# Patient Record
Sex: Female | Born: 1965
Health system: Southern US, Community
[De-identification: ages and names within clinical notes are randomized; demographics above are authoritative.]

## PROBLEM LIST (undated history)

## (undated) DIAGNOSIS — F419 Anxiety disorder, unspecified: Secondary | ICD-10-CM

## (undated) DIAGNOSIS — M1990 Unspecified osteoarthritis, unspecified site: Secondary | ICD-10-CM

## (undated) DIAGNOSIS — K635 Polyp of colon: Secondary | ICD-10-CM

## (undated) DIAGNOSIS — M25851 Other specified joint disorders, right hip: Secondary | ICD-10-CM

## (undated) DIAGNOSIS — E079 Disorder of thyroid, unspecified: Secondary | ICD-10-CM

## (undated) DIAGNOSIS — N200 Calculus of kidney: Secondary | ICD-10-CM

## (undated) DIAGNOSIS — M722 Plantar fascial fibromatosis: Secondary | ICD-10-CM

## (undated) HISTORY — DX: Polyp of colon: K63.5

## (undated) HISTORY — DX: Calculus of kidney: N20.0

## (undated) HISTORY — DX: Anxiety disorder, unspecified: F41.9

## (undated) HISTORY — DX: Unspecified osteoarthritis, unspecified site: M19.90

## (undated) HISTORY — DX: Plantar fascial fibromatosis: M72.2

## (undated) HISTORY — PX: BUNIONECTOMY: SHX129

## (undated) HISTORY — PX: TUBAL LIGATION: SHX77

## (undated) HISTORY — DX: Other specified joint disorders, right hip: M25.851

---

## 2012-08-27 DIAGNOSIS — M76899 Other specified enthesopathies of unspecified lower limb, excluding foot: Secondary | ICD-10-CM | POA: Insufficient documentation

## 2016-10-07 DIAGNOSIS — F40240 Claustrophobia: Secondary | ICD-10-CM | POA: Insufficient documentation

## 2017-04-10 DIAGNOSIS — D126 Benign neoplasm of colon, unspecified: Secondary | ICD-10-CM | POA: Insufficient documentation

## 2019-12-28 DIAGNOSIS — M722 Plantar fascial fibromatosis: Secondary | ICD-10-CM | POA: Insufficient documentation

## 2020-11-14 LAB — HM PAP SMEAR: HM Pap smear: NORMAL

## 2021-05-09 ENCOUNTER — Other Ambulatory Visit: Payer: Self-pay

## 2021-05-09 ENCOUNTER — Emergency Department
Admission: RE | Admit: 2021-05-09 | Discharge: 2021-05-09 | Disposition: A | Discharge status: Home / Self Care | Payer: BC Managed Care – PPO | Source: Ambulatory Visit

## 2021-05-09 VITALS — BP 124/85 | HR 82 | Temp 98.6°F | Resp 16 | Ht 62.0 in | Wt 129.0 lb

## 2021-05-09 DIAGNOSIS — H6690 Otitis media, unspecified, unspecified ear: Secondary | ICD-10-CM | POA: Diagnosis not present

## 2021-05-09 HISTORY — DX: Disorder of thyroid, unspecified: E07.9

## 2021-05-09 MED ORDER — AMOXICILLIN 500 MG PO CAPS: 500.0000 mg | ORAL_CAPSULE | Freq: Three times a day (TID) | ORAL | 0 refills | Status: DC

## 2021-05-09 NOTE — Discharge Instructions (Addendum)
See primary care for recheck or recheck here in 10 days.

## 2021-05-09 NOTE — ED Triage Notes (Signed)
Rt ear pain x 1 week Vaccinated

## 2021-05-10 NOTE — ED Provider Notes (Signed)
[name]    CSN: [id number] Arrival date & time: 05/09/21  1737      History   Chief Complaint Chief Complaint  Patient presents with  . Otalgia    HPI Kelly Holmes is a 55 y.o. female.   The history is provided by the patient. No language interpreter was used.  Otalgia Location:  Right Behind ear:  No abnormality Quality:  Aching Severity:  Moderate Onset quality:  Gradual Duration:  2 days Timing:  Constant Progression:  Worsening Chronicity:  New Relieved by:  Nothing Worsened by:  Nothing Ineffective treatments:  None tried Associated symptoms: no hearing loss   Risk factors: no recent travel     Past Medical History:  Diagnosis Date  . Thyroid disease     There are no problems to display for this patient.   History reviewed. No pertinent surgical history.  OB History   No obstetric history on file.      Home Medications    Prior to Admission medications   Medication Sig Start Date End Date Taking? Authorizing Provider  amoxicillin (AMOXIL) 500 MG capsule Take 1 capsule (500 mg total) by mouth 3 (three) times daily. 05/09/21  Yes Caryl Ada K, PA-C  levothyroxine (SYNTHROID) 100 MCG tablet Take 100 mcg by mouth daily before breakfast.   Yes [provider]    Family History Family History  Problem Relation Age of Onset  . Healthy Mother     Social History Social History   Tobacco Use  . Smoking status: Never Smoker  . Smokeless tobacco: Never Used  Vaping Use  . Vaping Use: Never used  Substance Use Topics  . Alcohol use: Yes     Allergies   Codeine and Naproxen   Review of Systems Review of Systems  HENT: Positive for ear pain. Negative for hearing loss.   All other systems reviewed and are negative.    Physical Exam Triage Vital Signs ED Triage Vitals  Enc Vitals Group     BP 05/09/21 1753 124/85     Pulse Rate 05/09/21 1753 82     Resp 05/09/21 1753 16     Temp 05/09/21 1753 98.6 F  (37 C)     Temp Source 05/09/21 1753 Oral     SpO2 --      Weight 05/09/21 1754 129 lb (58.5 kg)     Height 05/09/21 1754 5\' 2"  (1.575 m)     Head Circumference --      Peak Flow --      Pain Score 05/09/21 1754 8     Pain Loc --      Pain Edu? --      Excl. in [place]? --    No data found.  Updated Vital Signs BP 124/85 (BP Location: Right Arm)   Pulse 82   Temp 98.6 F (37 C) (Oral)   Resp 16   Ht 5\' 2"  (1.575 m)   Wt 58.5 kg   BMI 23.59 kg/m   Visual Acuity Right Eye Distance:   Left Eye Distance:   Bilateral Distance:    Right Eye Near:   Left Eye Near:    Bilateral Near:     Physical Exam Vitals reviewed.  HENT:     Left Ear: Tympanic membrane normal.     Ears:     Comments: Erythema around right tm.  White area occluding visualization of landmarks.  Cardiovascular:     Rate and Rhythm: Normal rate.  Pulmonary:     Effort: Pulmonary effort is normal.  Musculoskeletal:        General: Normal range of motion.  Skin:    General: Skin is warm.  Neurological:     General: No focal deficit present.     Mental Status: She is alert.  Psychiatric:        Mood and Affect: Mood normal.      UC Treatments / Results  Labs (all labs ordered are listed, but only abnormal results are displayed) Labs Reviewed - No data to display  EKG   Radiology No results found.  Procedures Procedures (including critical care time)  Medications Ordered in UC Medications - No data to display  Initial Impression / Assessment and Plan / UC Course  I have reviewed the triage vital signs and the nursing notes.  Pertinent labs & imaging results that were available during my care of the patient were reviewed by me and considered in my medical decision making (see chart for details).     MDM:  Pt given rx for antibiotics. I advised pt to recheck as bones are not visible.  Pt needs recheck if area does not clear  Consider ent referral  (possible unusual appearance of  cholesteatoma)  Final Clinical Impressions(s) / UC Diagnoses   Final diagnoses:  Acute otitis media, unspecified otitis media type     Discharge Instructions     See primary care for recheck or recheck here in 10 days.    ED Prescriptions    Medication Sig Dispense Auth. Provider   amoxicillin (AMOXIL) 500 MG capsule Take 1 capsule (500 mg total) by mouth 3 (three) times daily. 30 capsule Fransico Meadow, [place]     PDMP not reviewed this encounter.   Fransico Meadow, [place] 05/10/21 1004

## 2021-05-22 ENCOUNTER — Encounter: Payer: Self-pay | Admitting: Medical-Surgical

## 2021-05-22 ENCOUNTER — Other Ambulatory Visit: Payer: Self-pay

## 2021-05-22 ENCOUNTER — Ambulatory Visit: Payer: BC Managed Care – PPO | Admitting: Medical-Surgical

## 2021-05-22 VITALS — BP 109/71 | HR 73 | Temp 98.5°F | Ht 62.25 in | Wt 131.7 lb

## 2021-05-22 DIAGNOSIS — E039 Hypothyroidism, unspecified: Secondary | ICD-10-CM | POA: Diagnosis not present

## 2021-05-22 DIAGNOSIS — F419 Anxiety disorder, unspecified: Secondary | ICD-10-CM

## 2021-05-22 DIAGNOSIS — Z8669 Personal history of other diseases of the nervous system and sense organs: Secondary | ICD-10-CM

## 2021-05-22 DIAGNOSIS — Z09 Encounter for follow-up examination after completed treatment for conditions other than malignant neoplasm: Secondary | ICD-10-CM | POA: Diagnosis not present

## 2021-05-22 DIAGNOSIS — Z7689 Persons encountering health services in other specified circumstances: Secondary | ICD-10-CM

## 2021-05-22 NOTE — Progress Notes (Signed)
New Patient Office Visit  Subjective:  Patient ID: Kelly Holmes, female    DOB: [birth year]  Age: 55 y.o. MRN: [id number]  CC:  Chief Complaint  Patient presents with  . Establish Care    HPI Kelly Holmes presents to establish care.  [age]-year-old female who has relocated from [place].  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  She moved to [place] with her husband.  Was recently seen in urgent care for right otitis media.  She was treated with amoxicillin which she completed without side effects.  Notes that about 4 days into her treatment, her right ear pain disappeared.  She no longer has any issues with ear pain, drainage, popping, or fullness.  Was afebrile on evaluation and has not had any fever since then.  Hypothyroidism-has been on levothyroxine 50 mcg daily, tolerating well without side effects.  Has been on this dose for several years.  At her last thyroid level checked in December and will be due again for recheck in December of this year.  Anxiety-Notes that her anxiety is more of a panic attack scenario.  She uses Xanax 0.25 mg daily as needed but only averages approximately 2 doses per month.  She uses the Xanax when she awakens with significant anxiety and cannot shake it through the day.  Past Medical History:  Diagnosis Date  . Anxiety   . Colon polyps   . Femoroacetabular impingement of right hip   . Kidney stone   . Plantar fasciitis   . Thyroid disease     Past Surgical History:  Procedure Laterality Date  . BUNIONECTOMY    . TUBAL LIGATION      Family History  Problem Relation Age of Onset  . Healthy Mother   . Hypertension Father   . Heart attack Father   . Diabetes Father   . Skin cancer Sister   . Cancer Paternal Grandfather     Social History   Socioeconomic History  . Marital status: Married    Spouse name: Not on file  . Number of children: Not on file  . Years of  education: Not on file  . Highest education level: Not on file  Occupational History  . Not on file  Tobacco Use  . Smoking status: Never Smoker  . Smokeless tobacco: Never Used  Vaping Use  . Vaping Use: Never used  Substance and Sexual Activity  . Alcohol use: Yes    Alcohol/week: 2.0 standard drinks    Types: 2 Standard drinks or equivalent per week  . Drug use: Never  . Sexual activity: Yes    Partners: Male    Birth control/protection: Surgical, Post-menopausal    Comment: tubal ligation  Other Topics Concern  . Not on file  Social History Narrative  . Not on file   Social Determinants of Health   Financial Resource Strain: Not on file  Food Insecurity: Not on file  Transportation Needs: Not on file  Physical Activity: Not on file  Stress: Not on file  Social Connections: Not on file  Intimate Partner Violence: Not on file    ROS Review of Systems  Constitutional: Negative for chills, fatigue, fever and unexpected weight change.  HENT: Negative for ear discharge and ear pain.   Respiratory: Negative for cough, chest tightness, shortness of breath and wheezing.   Cardiovascular: Negative for chest pain, palpitations and leg swelling.  Genitourinary: Negative for dysuria, frequency, hematuria  and urgency.  Neurological: Negative for dizziness, light-headedness and headaches.  Psychiatric/Behavioral: Negative for dysphoric mood, self-injury, sleep disturbance and suicidal ideas. The patient is not nervous/anxious.     Objective:   Today's Vitals: BP 109/71   Pulse 73   Temp 98.5 F (36.9 C)   Ht 5' 2.25" (1.581 m)   Wt 131 lb 11.2 oz (59.7 kg)   SpO2 98%   BMI 23.90 kg/m   Physical Exam Vitals reviewed.  Constitutional:      General: She is not in acute distress.    Appearance: Normal appearance.  HENT:     Head: Normocephalic and atraumatic.  Cardiovascular:     Rate and Rhythm: Normal rate and regular rhythm.     Pulses: Normal pulses.     Heart  sounds: Normal heart sounds. No murmur heard. No friction rub. No gallop.   Pulmonary:     Effort: Pulmonary effort is normal. No respiratory distress.     Breath sounds: Normal breath sounds. No wheezing.  Skin:    General: Skin is warm and dry.  Neurological:     Mental Status: She is alert and oriented to person, place, and time.  Psychiatric:        Mood and Affect: Mood normal.        Behavior: Behavior normal.        Thought Content: Thought content normal.        Judgment: Judgment normal.    Assessment & Plan:   1. Encounter to establish care Reviewed available information and discussed healthcare concerns with patient.  She is up-to-date on preventative care although she is due for shingles vaccine.  She would like to get that in the fall when she gets her flu shot.  2. Otitis media follow-up, infection resolved Otitis media resolved.  3. Hypothyroidism, unspecified type Continue levothyroxine 50 mcg daily.  We will plan for recheck of TSH in December.  4. Anxiety Continue Xanax 0.25 mg daily as needed for anxiety.  Please use very sparingly and only for severe issues.   Outpatient Encounter Medications as of 05/22/2021  Medication Sig  . albuterol (VENTOLIN HFA) 108 (90 Base) MCG/ACT inhaler Inhale into the lungs every 6 (six) hours as needed for wheezing or shortness of breath.  . ALPRAZolam (XANAX) 0.25 MG tablet Take 0.25 mg by mouth daily as needed.  . Cholecalciferol (VITAMIN D) 50 MCG (2000 UT) tablet Take 2,000 Units by mouth daily.  Marland Kitchen levothyroxine (SYNTHROID) 50 MCG tablet Take 50 mcg by mouth daily.  . melatonin 5 MG TABS Take 5 mg by mouth at bedtime as needed.  . Probiotic Product (PROBIOTIC PO) Take by mouth daily.  Marland Kitchen UNABLE TO FIND Med Name: Plexus Bio Cleanse  . YUVAFEM 10 MCG TABS vaginal tablet Place 1 tablet vaginally 2 (two) times a week.  Marland Kitchen amoxicillin (AMOXIL) 500 MG capsule Take 1 capsule (500 mg total) by mouth 3 (three) times daily.  .  chlorhexidine (PERIDEX) 0.12 % solution SMARTSIG:By Mouth  . [DISCONTINUED] levothyroxine (SYNTHROID) 100 MCG tablet Take 100 mcg by mouth daily before breakfast.   No facility-administered encounter medications on file as of 05/22/2021.    Follow-up: Return in about 6 months (around 11/21/2021) for annual physical exam.   Clearnce Sorrel, DNP, APRN, FNP-BC [facility]

## 2021-11-21 ENCOUNTER — Encounter: Payer: BC Managed Care – PPO | Admitting: Medical-Surgical

## 2022-01-02 ENCOUNTER — Ambulatory Visit (INDEPENDENT_AMBULATORY_CARE_PROVIDER_SITE_OTHER): Payer: BC Managed Care – PPO | Admitting: Medical-Surgical

## 2022-01-02 ENCOUNTER — Other Ambulatory Visit: Payer: Self-pay

## 2022-01-02 ENCOUNTER — Encounter: Payer: Self-pay | Admitting: Medical-Surgical

## 2022-01-02 VITALS — BP 122/89 | HR 102 | Resp 20 | Ht 62.0 in | Wt 130.6 lb

## 2022-01-02 DIAGNOSIS — Z0000 Encounter for general adult medical examination without abnormal findings: Secondary | ICD-10-CM

## 2022-01-02 DIAGNOSIS — Z808 Family history of malignant neoplasm of other organs or systems: Secondary | ICD-10-CM | POA: Diagnosis not present

## 2022-01-02 DIAGNOSIS — Z23 Encounter for immunization: Secondary | ICD-10-CM | POA: Diagnosis not present

## 2022-01-02 DIAGNOSIS — Z1329 Encounter for screening for other suspected endocrine disorder: Secondary | ICD-10-CM | POA: Diagnosis not present

## 2022-01-02 DIAGNOSIS — R21 Rash and other nonspecific skin eruption: Secondary | ICD-10-CM

## 2022-01-02 DIAGNOSIS — Z131 Encounter for screening for diabetes mellitus: Secondary | ICD-10-CM

## 2022-01-02 DIAGNOSIS — E039 Hypothyroidism, unspecified: Secondary | ICD-10-CM

## 2022-01-02 DIAGNOSIS — H579 Unspecified disorder of eye and adnexa: Secondary | ICD-10-CM | POA: Diagnosis not present

## 2022-01-02 DIAGNOSIS — Z1211 Encounter for screening for malignant neoplasm of colon: Secondary | ICD-10-CM

## 2022-01-02 MED ORDER — CLOTRIMAZOLE-BETAMETHASONE 1-0.05 % EX CREA: 1.0000 "application " | TOPICAL_CREAM | Freq: Two times a day (BID) | CUTANEOUS | 1 refills | Status: DC | PRN

## 2022-01-02 NOTE — Progress Notes (Signed)
Medical screening examination/treatment was performed by qualified clinical staff member and as supervising physician I was immediately available for consultation/collaboration. I have reviewed documentation and agree with assessment and plan.  Clearnce Sorrel, DNP, APRN, FNP-BC [facility]

## 2022-01-02 NOTE — Patient Instructions (Signed)
Preventive Care 55-55 Years Old, Female °Preventive care refers to lifestyle choices and visits with your health care provider that can promote health and wellness. Preventive care visits are also called wellness exams. °What can I expect for my preventive care visit? °Counseling °Your health care provider may ask you questions about your: °Medical history, including: °Past medical problems. °Family medical history. °Pregnancy history. °Current health, including: °Menstrual cycle. °Method of birth control. °Emotional well-being. °Home life and relationship well-being. °Sexual activity and sexual health. °Lifestyle, including: °Alcohol, nicotine or tobacco, and drug use. °Access to firearms. °Diet, exercise, and sleep habits. °Work and work environment. °Sunscreen use. °Safety issues such as seatbelt and bike helmet use. °Physical exam °Your health care provider will check your: °Height and weight. These may be used to calculate your BMI (body mass index). BMI is a measurement that tells if you are at a healthy weight. °Waist circumference. This measures the distance around your waistline. This measurement also tells if you are at a healthy weight and may help predict your risk of certain diseases, such as type 2 diabetes and high blood pressure. °Heart rate and blood pressure. °Body temperature. °Skin for abnormal spots. °What immunizations do I need? °Vaccines are usually given at various ages, according to a schedule. Your health care provider will recommend vaccines for you based on your age, medical history, and lifestyle or other factors, such as travel or where you work. °What tests do I need? °Screening °Your health care provider may recommend screening tests for certain conditions. This may include: °Lipid and cholesterol levels. °Diabetes screening. This is done by checking your blood sugar (glucose) after you have not eaten for a while (fasting). °Pelvic exam and Pap test. °Hepatitis B test. °Hepatitis C  test. °HIV (human immunodeficiency virus) test. °STI (sexually transmitted infection) testing, if you are at risk. °Lung cancer screening. °Colorectal cancer screening. °Mammogram. Talk with your health care provider about when you should start having regular mammograms. This may depend on whether you have a family history of breast cancer. °BRCA-related cancer screening. This may be done if you have a family history of breast, ovarian, tubal, or peritoneal cancers. °Bone density scan. This is done to screen for osteoporosis. °Talk with your health care provider about your test results, treatment options, and if necessary, the need for more tests. °Follow these instructions at home: °Eating and drinking ° °Eat a diet that includes fresh fruits and vegetables, whole grains, lean protein, and low-fat dairy products. °Take vitamin and mineral supplements as recommended by your health care provider. °Do not drink alcohol if: °Your health care provider tells you not to drink. °You are pregnant, may be pregnant, or are planning to become pregnant. °If you drink alcohol: °Limit how much you have to 0-1 drink a day. °Know how much alcohol is in your drink. In the [place] one drink equals one 12 oz bottle of beer (355 mL), one 5 oz glass of wine (148 mL), or one 1½ oz glass of hard liquor (44 mL). °Lifestyle °Brush your teeth every morning and night with fluoride toothpaste. Floss one time each day. °Exercise for at least 30 minutes 5 or more days each week. °Do not use any products that contain nicotine or tobacco. These products include cigarettes, chewing tobacco, and vaping devices, such as e-cigarettes. If you need help quitting, ask your health care provider. °Do not use drugs. °If you are sexually active, practice safe sex. Use a condom or other form of protection to prevent   STIs. If you do not wish to become pregnant, use a form of birth control. If you plan to become pregnant, see your health care provider for a  prepregnancy visit. Take aspirin only as told by your health care provider. Make sure that you understand how much to take and what form to take. Work with your health care provider to find out whether it is safe and beneficial for you to take aspirin daily. Find healthy ways to manage stress, such as: Meditation, yoga, or listening to music. Journaling. Talking to a trusted person. Spending time with friends and family. Minimize exposure to UV radiation to reduce your risk of skin cancer. Safety Always wear your seat belt while driving or riding in a vehicle. Do not drive: If you have been drinking alcohol. Do not ride with someone who has been drinking. When you are tired or distracted. While texting. If you have been using any mind-altering substances or drugs. Wear a helmet and other protective equipment during sports activities. If you have firearms in your house, make sure you follow all gun safety procedures. Seek help if you have been physically or sexually abused. What's next? Visit your health care provider once a year for an annual wellness visit. Ask your health care provider how often you should have your eyes and teeth checked. Stay up to date on all vaccines. This information is not intended to replace advice given to you by your health care provider. Make sure you discuss any questions you have with your health care provider. Document Revised: 05/29/2021 Document Reviewed: 05/29/2021 Elsevier Patient Education  [place].

## 2022-01-02 NOTE — Progress Notes (Signed)
CC:  annual exam  HPI: Kelly Holmes is a 56 y.o. pleasant female who  has a past medical history of Anxiety, Arthritis (2013), Colon polyps, Femoroacetabular impingement of right hip, Kidney stone, Plantar fasciitis, and Thyroid disease.  she presents to [facility] today, for chief complaint of: Annual physical exam  Dentist:  being seen by dentist for recent root canal and gum swelling. Improved, but sometimes notices swelling.  Eye exam: was seen last Jan where they told her she had blood vessels in cornea, notes some eye drooping. Denies vision changes. Needs referral. Exercise: Pilates 3x/week as well as pickle ball Diet: no restrictions Pap smear: UTD Mammogram: UTD Colon cancer screening: referral to GI needed for abnormal colonoscopy COVID vaccine: UTD  Concerns: Pleasant female presenting for annual exam. Would like a dermatology referral d/t family hx of skin cancer.Complaint today is a rash under each armpit. She noticed over the last couple weeks patches under her arm pits. She changed deodorant and tried hydrocortisone cream on it. Michela Pitcher it helped some, but still present. States it itches mostly after exercise. Denies burning or d/c.   Past medical, surgical, social and family history reviewed:  Patient Active Problem List   Diagnosis Date Noted   Acquired hypothyroidism 01/02/2022   Plantar fasciitis 12/28/2019   Colon adenomas 04/10/2017   Claustrophobia 10/07/2016   Enthesopathy of hip region 08/27/2012    Past Surgical History:  Procedure Laterality Date   BUNIONECTOMY     TUBAL LIGATION      Social History   Tobacco Use   Smoking status: Never   Smokeless tobacco: Never  Substance Use Topics   Alcohol use: Yes    Alcohol/week: 2.0 standard drinks    Types: 2 Standard drinks or equivalent per week    Family History  Problem Relation Age of Onset   Healthy Mother    Arthritis Mother    Depression Mother     Hypertension Father    Heart attack Father    Diabetes Father    Cancer Father    Skin cancer Sister    Cancer Paternal Grandfather    Cancer Maternal Grandfather    ADD / ADHD Daughter    Cancer Sister    Hypertension Sister      Current medication list and allergy/intolerance information reviewed:    Current Outpatient Medications  Medication Sig Dispense Refill   ALPRAZolam (XANAX) 0.25 MG tablet Take 0.25 mg by mouth daily as needed.     Cholecalciferol (VITAMIN D) 50 MCG (2000 UT) tablet Take 2,000 Units by mouth daily.     clotrimazole-betamethasone (LOTRISONE) cream Apply 1 application topically 2 (two) times daily as needed. Do not use for more than 14 consecutive days. 30 g 1   levothyroxine (SYNTHROID) 50 MCG tablet Take 50 mcg by mouth daily.     melatonin 5 MG TABS Take 5 mg by mouth at bedtime as needed.     Probiotic Product (PROBIOTIC PO) Take by mouth daily.     UNABLE TO FIND Med Name: Plexus Bio Cleanse     YUVAFEM 10 MCG TABS vaginal tablet Place 1 tablet vaginally 2 (two) times a week.     No current facility-administered medications for this visit.    Allergies  Allergen Reactions   Codeine Hives and Nausea And Vomiting   Naproxen Hives   Hydrocodone-Acetaminophen Hives      Review of Systems: Constitutional:  No  fever, no chills, No recent illness,  No unintentional weight changes. No significant fatigue.  HEENT: No  headache, no vision change, no hearing change, No sore throat, No  sinus pressure Cardiac: No  chest pain, No  pressure, No palpitations, No  Orthopnea Respiratory:  No  shortness of breath. No  Cough Gastrointestinal: No  abdominal pain, No  nausea, No  vomiting,  No  blood in stool, No  diarrhea, No  constipation  Musculoskeletal: No new myalgia/arthralgia Skin: positive for Rash bilateral armpits, No other wounds/concerning lesions Genitourinary: No  incontinence, No  abnormal genital bleeding, No abnormal genital discharge Hem/Onc:  No  easy bruising/bleeding, No  abnormal lymph node Endocrine: No cold intolerance,  No heat intolerance. No polyuria/polydipsia/polyphagia  Neurologic: No  weakness, No  dizziness, No  slurred speech/focal weakness/facial droop Psychiatric: No  concerns with depression, No  concerns with anxiety, No sleep problems, No mood problems  Exam:  BP 122/89 (BP Location: Left Arm, Patient Position: Sitting, Cuff Size: Normal)    Pulse (!) 102    Resp 20    Ht _0  (1.575 m)    Wt 59.2 kg    SpO2 98%    BMI 23.89 kg/m   Constitutional: VS see above. General Appearance: alert, well-developed, well-nourished, NAD Eyes: Normal lids and conjunctive, non-icteric sclera Ears, Nose, Mouth, Throat: MMM, Normal external inspection ears/nares/mouth/lips/gums. TM normal bilaterally.  Neck: No masses, trachea midline. No thyroid enlargement. No tenderness/mass appreciated. No lymphadenopathy Respiratory: Normal respiratory effort. no wheeze, no rhonchi, no rales Cardiovascular: S1/S2 normal, no murmur, no rub/gallop auscultated. RRR. No lower extremity edema. Pedal pulse II/IV bilaterally DP and PT. No carotid bruit or JVD. Gastrointestinal: Nontender, no masses. No hepatomegaly, no splenomegaly. No hernia appreciated. Bowel sounds normal. Rectal exam deferred.  Musculoskeletal: Gait normal. No clubbing/cyanosis of digits.  Neurological: Normal balance/coordination. No tremor. No cranial nerve deficit on limited exam. Motor and sensation intact and symmetric. Cerebellar reflexes intact.  Skin:Rash present: multiple erythematous macules present bilaterally under each axilla with well defined edges. No papules or vesicles present. Under woods lamp,slightly green fluorescent. No scaling or drainage noted.   No concerning nevi or subq nodules on limited exam.   Psychiatric: Normal judgment/insight. Normal mood and affect. Oriented x3.    ASSESSMENT/PLAN:   1. Annual physical exam Routine lab work today - Lipid  panel - COMPLETE METABOLIC PANEL WITH GFR - CBC with Differential/Platelet  2. Need for shingles vaccine Receiving vaccine today - Varicella-zoster vaccine IM (Shingrix)  3. Family history of skin cancer Would like referral to derm  - Ambulatory referral to Dermatology  4. Eye exam abnormal Was seen in January, noted to have blood vessels in cornea and eye drooping. Would like referral to ophthalmology. No abnormal finding on exam. - Ambulatory referral to Ophthalmology  5. Colon cancer screening Need referral to GI for hx of abnormal colonoscopy - Ambulatory referral to Gastroenterology  6. Rash Discussed keeping area clean and dry, discussed using baby powder, and changing soap. Discussed changing out razor and using baby oil when shaving to help with irritation. Will trial Lotrisone cream to each armpit and follow up if symptoms worsening or not going away. - clotrimazole-betamethasone (LOTRISONE) cream; Apply 1 application topically 2 (two) times daily as needed. Do not use for more than 14 consecutive days.  Dispense: 30 g; Refill: 1  7. Diabetes mellitus screening Lab work today - Hemoglobin A1c  8. Acquired hypothyroidism Lab work today  - TSH   Orders Placed This Encounter  Procedures  Varicella-zoster vaccine IM (Shingrix)   TSH   Lipid panel   COMPLETE METABOLIC PANEL WITH GFR   CBC with Differential/Platelet   Hemoglobin A1c   HM PAP SMEAR   Ambulatory referral to Dermatology   Ambulatory referral to Ophthalmology   Ambulatory referral to Gastroenterology    Meds ordered this encounter  Medications   clotrimazole-betamethasone (LOTRISONE) cream    Sig: Apply 1 application topically 2 (two) times daily as needed. Do not use for more than 14 consecutive days.    Dispense:  30 g    Refill:  1    Order Specific Question:   Supervising Provider    Answer:   Luetta Nutting [[id number]]    Patient Instructions  Preventive Care 67-42 Years Old,  Female Preventive care refers to lifestyle choices and visits with your health care provider that can promote health and wellness. Preventive care visits are also called wellness exams. What can I expect for my preventive care visit? Counseling Your health care provider may ask you questions about your: Medical history, including: Past medical problems. Family medical history. Pregnancy history. Current health, including: Menstrual cycle. Method of birth control. Emotional well-being. Home life and relationship well-being. Sexual activity and sexual health. Lifestyle, including: Alcohol, nicotine or tobacco, and drug use. Access to firearms. Diet, exercise, and sleep habits. Work and work Statistician. Sunscreen use. Safety issues such as seatbelt and bike helmet use. Physical exam Your health care provider will check your: Height and weight. These may be used to calculate your BMI (body mass index). BMI is a measurement that tells if you are at a healthy weight. Waist circumference. This measures the distance around your waistline. This measurement also tells if you are at a healthy weight and may help predict your risk of certain diseases, such as type 2 diabetes and high blood pressure. Heart rate and blood pressure. Body temperature. Skin for abnormal spots. What immunizations do I need? Vaccines are usually given at various ages, according to a schedule. Your health care provider will recommend vaccines for you based on your age, medical history, and lifestyle or other factors, such as travel or where you work. What tests do I need? Screening Your health care provider may recommend screening tests for certain conditions. This may include: Lipid and cholesterol levels. Diabetes screening. This is done by checking your blood sugar (glucose) after you have not eaten for a while (fasting). Pelvic exam and Pap test. Hepatitis B test. Hepatitis C test. HIV (human immunodeficiency  virus) test. STI (sexually transmitted infection) testing, if you are at risk. Lung cancer screening. Colorectal cancer screening. Mammogram. Talk with your health care provider about when you should start having regular mammograms. This may depend on whether you have a family history of breast cancer. BRCA-related cancer screening. This may be done if you have a family history of breast, ovarian, tubal, or peritoneal cancers. Bone density scan. This is done to screen for osteoporosis. Talk with your health care provider about your test results, treatment options, and if necessary, the need for more tests. Follow these instructions at home: Eating and drinking  Eat a diet that includes fresh fruits and vegetables, whole grains, lean protein, and low-fat dairy products. Take vitamin and mineral supplements as recommended by your health care provider. Do not drink alcohol if: Your health care provider tells you not to drink. You are pregnant, may be pregnant, or are planning to become pregnant. If you drink alcohol: Limit how much you have to  0-1 drink a day. Know how much alcohol is in your drink. In the [place] one drink equals one 12 oz bottle of beer (355 mL), one 5 oz glass of wine (148 mL), or one 1 oz glass of hard liquor (44 mL). Lifestyle Brush your teeth every morning and night with fluoride toothpaste. Floss one time each day. Exercise for at least 30 minutes 5 or more days each week. Do not use any products that contain nicotine or tobacco. These products include cigarettes, chewing tobacco, and vaping devices, such as e-cigarettes. If you need help quitting, ask your health care provider. Do not use drugs. If you are sexually active, practice safe sex. Use a condom or other form of protection to prevent STIs. If you do not wish to become pregnant, use a form of birth control. If you plan to become pregnant, see your health care provider for a prepregnancy visit. Take aspirin only  as told by your health care provider. Make sure that you understand how much to take and what form to take. Work with your health care provider to find out whether it is safe and beneficial for you to take aspirin daily. Find healthy ways to manage stress, such as: Meditation, yoga, or listening to music. Journaling. Talking to a trusted person. Spending time with friends and family. Minimize exposure to UV radiation to reduce your risk of skin cancer. Safety Always wear your seat belt while driving or riding in a vehicle. Do not drive: If you have been drinking alcohol. Do not ride with someone who has been drinking. When you are tired or distracted. While texting. If you have been using any mind-altering substances or drugs. Wear a helmet and other protective equipment during sports activities. If you have firearms in your house, make sure you follow all gun safety procedures. Seek help if you have been physically or sexually abused. What's next? Visit your health care provider once a year for an annual wellness visit. Ask your health care provider how often you should have your eyes and teeth checked. Stay up to date on all vaccines. This information is not intended to replace advice given to you by your health care provider. Make sure you discuss any questions you have with your health care provider. Document Revised: 05/29/2021 Document Reviewed: 05/29/2021 Elsevier Patient Education  2022 [employer].    Follow-up plan: Return for Follow up as needed, or return in 1 year for annual well exam.  Jeanann Lewandowsky, Student NP

## 2022-01-03 LAB — COMPLETE METABOLIC PANEL WITH GFR
AG Ratio: 2 (calc) (ref 1.0–2.5)
ALT: 16 U/L (ref 6–29)
AST: 18 U/L (ref 10–35)
Albumin: 4.9 g/dL (ref 3.6–5.1)
Alkaline phosphatase (APISO): 75 U/L (ref 37–153)
BUN: 21 mg/dL (ref 7–25)
CO2: 33 mmol/L — ABNORMAL HIGH (ref 20–32)
Calcium: 10.2 mg/dL (ref 8.6–10.4)
Chloride: 103 mmol/L (ref 98–110)
Creat: 0.88 mg/dL (ref 0.50–1.03)
Globulin: 2.5 g/dL (calc) (ref 1.9–3.7)
Glucose, Bld: 71 mg/dL (ref 65–99)
Potassium: 4.3 mmol/L (ref 3.5–5.3)
Sodium: 140 mmol/L (ref 135–146)
Total Bilirubin: 0.5 mg/dL (ref 0.2–1.2)
Total Protein: 7.4 g/dL (ref 6.1–8.1)
eGFR: 78 mL/min/{1.73_m2} (ref >=60)

## 2022-01-03 LAB — LIPID PANEL
Cholesterol: 204 mg/dL — ABNORMAL HIGH (ref <200)
HDL: 96 mg/dL (ref >=50)
LDL Cholesterol (Calc): 94 mg/dL (calc)
Non-HDL Cholesterol (Calc): 108 mg/dL (calc) (ref <130)
Total CHOL/HDL Ratio: 2.1 (calc) (ref <5.0)
Triglycerides: 48 mg/dL (ref <150)

## 2022-01-03 LAB — CBC WITH DIFFERENTIAL/PLATELET
Absolute Monocytes: 335 cells/uL (ref 200–950)
Basophils Absolute: 10 cells/uL (ref 0–200)
Basophils Relative: 0.2 %
Eosinophils Absolute: 30 cells/uL (ref 15–500)
Eosinophils Relative: 0.6 %
HCT: 42.8 % (ref 35.0–45.0)
Hemoglobin: 14.4 g/dL (ref 11.7–15.5)
Lymphs Abs: 1575 cells/uL (ref 850–3900)
MCH: 29.4 pg (ref 27.0–33.0)
MCHC: 33.6 g/dL (ref 32.0–36.0)
MCV: 87.3 fL (ref 80.0–100.0)
MPV: 9.1 fL (ref 7.5–12.5)
Monocytes Relative: 6.7 %
Neutro Abs: 3050 cells/uL (ref 1500–7800)
Neutrophils Relative %: 61 %
Platelets: 309 10*3/uL (ref 140–400)
RBC: 4.9 10*6/uL (ref 3.80–5.10)
RDW: 12.7 % (ref 11.0–15.0)
Total Lymphocyte: 31.5 %
WBC: 5 10*3/uL (ref 3.8–10.8)

## 2022-01-03 LAB — TSH: TSH: 2.17 mIU/L

## 2022-01-03 LAB — HEMOGLOBIN A1C
Hgb A1c MFr Bld: 5.2 % of total Hgb (ref <5.7)
Mean Plasma Glucose: 103 mg/dL
eAG (mmol/L): 5.7 mmol/L

## 2022-01-04 ENCOUNTER — Encounter: Payer: Self-pay | Admitting: Medical-Surgical

## 2022-01-04 DIAGNOSIS — Z1231 Encounter for screening mammogram for malignant neoplasm of breast: Secondary | ICD-10-CM

## 2022-01-14 ENCOUNTER — Telehealth: Payer: Self-pay | Admitting: Physician Assistant

## 2022-01-14 NOTE — Telephone Encounter (Signed)
Patient is calling for a referral appointment from Samuel Bouche, NP.  Patient is schedule for 08/06/2022 at 2:00 with [facility], PA-C.

## 2022-01-14 NOTE — Telephone Encounter (Signed)
Mammogram ordered. Patient is aware.

## 2022-01-15 NOTE — Telephone Encounter (Signed)
Referral attached to appointment

## 2022-01-30 ENCOUNTER — Other Ambulatory Visit: Payer: Self-pay | Admitting: Medical-Surgical

## 2022-01-30 MED ORDER — LEVOTHYROXINE SODIUM 50 MCG PO TABS: 50.0000 ug | ORAL_TABLET | Freq: Every day | ORAL | 3 refills | Status: DC

## 2022-03-03 ENCOUNTER — Encounter: Payer: Self-pay | Admitting: Medical-Surgical

## 2022-03-03 DIAGNOSIS — M79671 Pain in right foot: Secondary | ICD-10-CM

## 2022-03-04 NOTE — Telephone Encounter (Signed)
Referral pended please sign if appropriate.

## 2022-03-13 ENCOUNTER — Ambulatory Visit (INDEPENDENT_AMBULATORY_CARE_PROVIDER_SITE_OTHER): Payer: BC Managed Care – PPO

## 2022-03-13 DIAGNOSIS — M79672 Pain in left foot: Secondary | ICD-10-CM

## 2022-03-13 DIAGNOSIS — M84374A Stress fracture, right foot, initial encounter for fracture: Secondary | ICD-10-CM | POA: Diagnosis not present

## 2022-03-13 DIAGNOSIS — Z1231 Encounter for screening mammogram for malignant neoplasm of breast: Secondary | ICD-10-CM | POA: Diagnosis not present

## 2022-03-13 DIAGNOSIS — Z0389 Encounter for observation for other suspected diseases and conditions ruled out: Secondary | ICD-10-CM | POA: Diagnosis not present

## 2022-03-14 ENCOUNTER — Encounter: Payer: Self-pay | Admitting: Podiatry

## 2022-03-14 ENCOUNTER — Ambulatory Visit: Payer: BC Managed Care – PPO | Admitting: Podiatry

## 2022-03-14 DIAGNOSIS — M722 Plantar fascial fibromatosis: Secondary | ICD-10-CM

## 2022-03-14 DIAGNOSIS — M79672 Pain in left foot: Secondary | ICD-10-CM

## 2022-03-14 DIAGNOSIS — M84374A Stress fracture, right foot, initial encounter for fracture: Secondary | ICD-10-CM | POA: Diagnosis not present

## 2022-03-14 DIAGNOSIS — M79671 Pain in right foot: Secondary | ICD-10-CM

## 2022-03-14 MED ORDER — METHYLPREDNISOLONE 4 MG PO TBPK: ORAL_TABLET | ORAL | 0 refills | Status: DC

## 2022-03-14 NOTE — Progress Notes (Signed)
?  Subjective:  ?Patient ID: Kelly Holmes, female    DOB: June 09, 1966,   MRN: [id number] ? ?Chief Complaint  ?Patient presents with  ? Foot Pain  ?  Bilateral foot pain   ? ? ?56 y.o. female presents for concern of bilateral foot pain that has been ongoing since the fall of 2020. She has a history of plantar fasciitis . Relates there is a nerve on the top of her right foot that is painful and left heel is painful when pressure is applied. Relates her pain is different from prior. She has tried icing, otc medicines, and stretching. She has also undergone PT . Denies any other pedal complaints. Denies n/v/f/c.  ? ?Past Medical History:  ?Diagnosis Date  ? Anxiety   ? Arthritis 2013  ? Colon polyps   ? Femoroacetabular impingement of right hip   ? Kidney stone   ? Plantar fasciitis   ? Thyroid disease   ? ? ?Objective:  ?Physical Exam: ?Vascular: DP/PT pulses 2/4 bilateral. CFT <3 seconds. Normal hair growth on digits. No edema.  ?Skin. No lacerations or abrasions bilateral feet.  ?Musculoskeletal: MMT 5/5 bilateral lower extremities in DF, PF, Inversion and Eversion. Deceased ROM in DF of ankle joint. Tender over second metatarsal dorsally and pain with vibration test over the second metatarsal. Pain to lateral band of plantar fascia on left at calcaneus. No pain along arch or medially in the left heel.  ?Neurological: Sensation intact to light touch.  ? ?Assessment:  ? ?1. Plantar fasciitis of left foot   ?2. Stress reaction of right foot, initial encounter   ? ? ? ?Plan:  ?Patient was evaluated and treated and all questions answered. ?Discussed  stress reaction of right second metatatrsal. plantar fasciitis with patient.  ?X-rays reviewed and discussed with patient. No acute fractures or dislocations noted. Mild spurring noted at inferior calcaneus.  ?Discussed treatment options including, ice, NSAIDS, supportive shoes, bracing, and stretching. ?Continue stretching ?Prescription for medrol dose pack provided.  ?CAM  boot dispensed and advised to wear whenever walking.  ?Follow-up 4 weeks for repeat XR on the right foot.  ? ? ? ?Lorenda Peck, DPM  ? ? ?

## 2022-03-14 NOTE — Patient Instructions (Signed)

## 2022-03-24 NOTE — Progress Notes (Signed)
Please call patient. Normal mammogram.  Repeat in 1 year.  

## 2022-04-03 ENCOUNTER — Other Ambulatory Visit: Payer: Self-pay | Admitting: Medical-Surgical

## 2022-04-03 ENCOUNTER — Telehealth: Payer: Self-pay | Admitting: *Deleted

## 2022-04-03 MED ORDER — ALPRAZOLAM 0.25 MG PO TABS: 0.2500 mg | ORAL_TABLET | Freq: Every day | ORAL | 0 refills | Status: DC | PRN

## 2022-04-03 NOTE — Telephone Encounter (Signed)
Patient is calling because her foot pain has come back this week, is having the pain, toe numbness at times,arch pain, no support in the boot. She is going out of the country 04[place]15[place]. Should she be seen sooner for upcoming  appointment? ?

## 2022-04-04 NOTE — Telephone Encounter (Signed)
Please schedule for sooner appointment w/ Dr Blenda Mounts.

## 2022-04-04 NOTE — Telephone Encounter (Signed)
I left her a message to call back for sooner appointment, we will have to figure out a place to put her.

## 2022-04-10 ENCOUNTER — Encounter: Payer: Self-pay | Admitting: Podiatry

## 2022-04-10 ENCOUNTER — Ambulatory Visit: Payer: BC Managed Care – PPO | Admitting: Podiatry

## 2022-04-10 DIAGNOSIS — M84374A Stress fracture, right foot, initial encounter for fracture: Secondary | ICD-10-CM

## 2022-04-10 DIAGNOSIS — G5791 Unspecified mononeuropathy of right lower limb: Secondary | ICD-10-CM

## 2022-04-10 DIAGNOSIS — G5792 Unspecified mononeuropathy of left lower limb: Secondary | ICD-10-CM | POA: Diagnosis not present

## 2022-04-10 DIAGNOSIS — M722 Plantar fascial fibromatosis: Secondary | ICD-10-CM | POA: Diagnosis not present

## 2022-04-10 MED ORDER — DEXAMETHASONE SODIUM PHOSPHATE 120 MG/30ML IJ SOLN
8.0000 mg | Freq: Once | INTRAMUSCULAR | Status: AC
  Administered 2022-04-10: 8 mg via INTRA_ARTICULAR

## 2022-04-10 MED ORDER — METHYLPREDNISOLONE 4 MG PO TBPK: ORAL_TABLET | ORAL | 0 refills | Status: DC

## 2022-04-10 NOTE — Progress Notes (Signed)
?  Subjective:  ?Patient ID: Kelly Holmes, female    DOB: 01-30-1966,   MRN: [id number] ? ?No chief complaint on file. ? ? ?56 y.o. female presents for follow-up of bilateral foot pain primarily the right foot. Relates she was wearing the boot but gives her foot no support and made plantar fasciitis flare up. Relates the steroid helped a little.   Concerned about her trip to [place] and does not want to be in any pain. Denies any other pedal complaints. Denies n/v/f/c.  ? ?Past Medical History:  ?Diagnosis Date  ? Anxiety   ? Arthritis 2013  ? Colon polyps   ? Femoroacetabular impingement of right hip   ? Kidney stone   ? Plantar fasciitis   ? Thyroid disease   ? ? ?Objective:  ?Physical Exam: ?Vascular: DP/PT pulses 2/4 bilateral. CFT <3 seconds. Normal hair growth on digits. No edema.  ?Skin. No lacerations or abrasions bilateral feet.  ?Musculoskeletal: MMT 5/5 bilateral lower extremities in DF, PF, Inversion and Eversion. Deceased ROM in DF of ankle joint. Tenderness appears to be primarily in the first interspace bilateral today. Does have some pain with metatarsal squeeze. Pain travels back into the arch mostly on the left. Pain to lateral band of plantar fascia on left at calcaneus. No pain along arch or medially in the left heel.  ?Neurological: Sensation intact to light touch.  ? ?Assessment:  ? ?1. Neuritis of right foot   ?2. Plantar fasciitis of left foot   ?3. Stress reaction of right foot, initial encounter   ?4. Neuritis of left foot   ? ? ? ? ?Plan:  ?Patient was evaluated and treated and all questions answered. ?Discussed  stress reaction of right second metatatrsal vs neuritis vs capsulitis vs. plantar fasciitis with patient.  ?X-rays reviewed and discussed with patient. No acute fractures or dislocations noted. Mild spurring noted at inferior calcaneus.  ?Discussed treatment options including, ice, NSAIDS, supportive shoes, bracing, and stretching. ?Continue stretching ?Prescription for medrol dose  pack provided. Refilled.  ?Continue in supportive shoes.  ?Requesting injection today. Procedure below.  ?Follow-up after trip if needed.  ? ?Procedure: Injection Tendon/Ligament ?Discussed alternatives, risks, complications and verbal consent was obtained.  ?Location: Bilateral first interspace. ?Skin Prep: Alcohol. ?Injectate: 1cc 0.5% marcaine plain, 1 cc dexamethasone.  ?Disposition: Patient tolerated procedure well. Injection site dressed with a band-aid.  ?Post-injection care was discussed and return precautions discussed.  ? ? ? ? ?Lorenda Peck, DPM  ? ? ?

## 2022-04-11 ENCOUNTER — Ambulatory Visit: Payer: BC Managed Care – PPO | Admitting: Podiatry

## 2022-05-04 ENCOUNTER — Encounter: Payer: Self-pay | Admitting: Medical-Surgical

## 2022-05-08 ENCOUNTER — Ambulatory Visit: Payer: BC Managed Care – PPO | Admitting: Podiatry

## 2022-06-06 ENCOUNTER — Ambulatory Visit: Payer: BC Managed Care – PPO

## 2022-06-06 IMAGING — MG MM DIGITAL SCREENING BILAT W/ TOMO AND CAD
8 series · 8 of 24 positions shown · non-contrast
Comparison: Previous exam(s).

CLINICAL DATA: Screening.

EXAM:
DIGITAL SCREENING BILATERAL MAMMOGRAM WITH TOMOSYNTHESIS AND CAD
TECHNIQUE: Bilateral screening digital craniocaudal and mediolateral oblique
mammograms were obtained. Bilateral screening digital breast
tomosynthesis was performed. The images were evaluated with
computer-aided detection.

[R CC synth-2D]
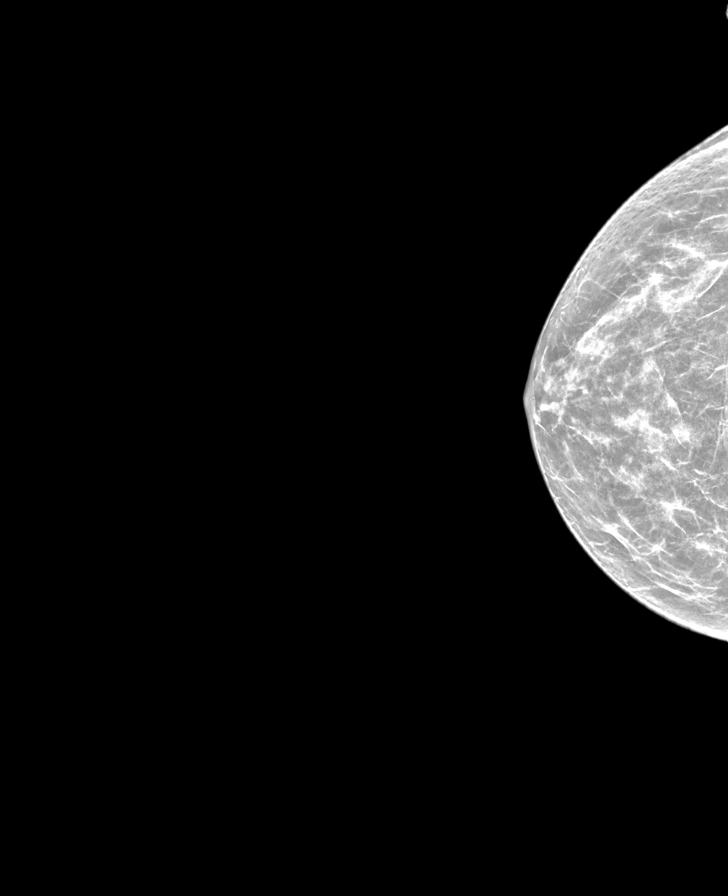

[L CC synth-2D]
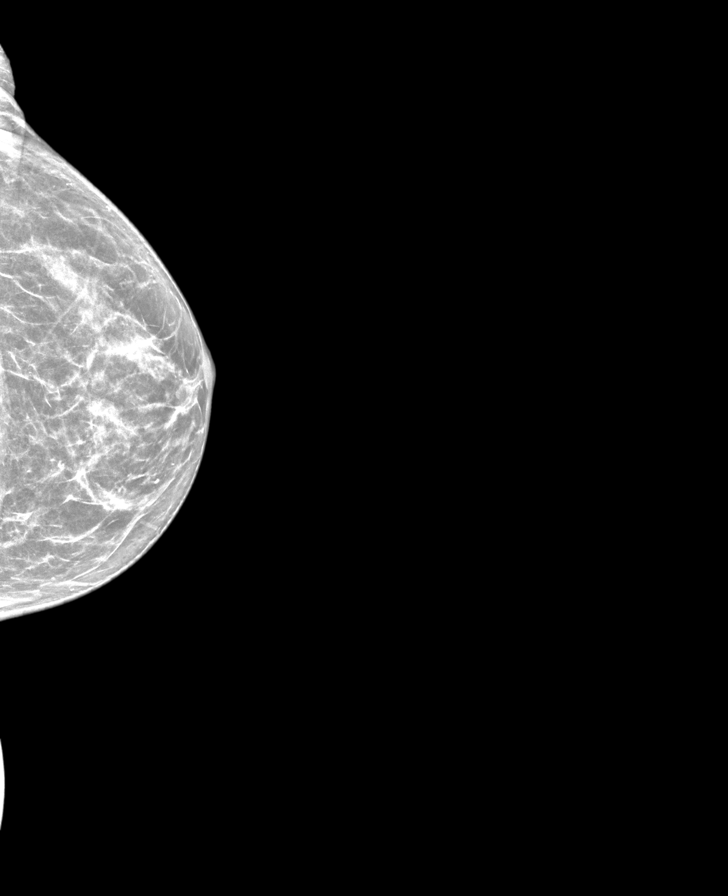

[L MLO synth-2D]
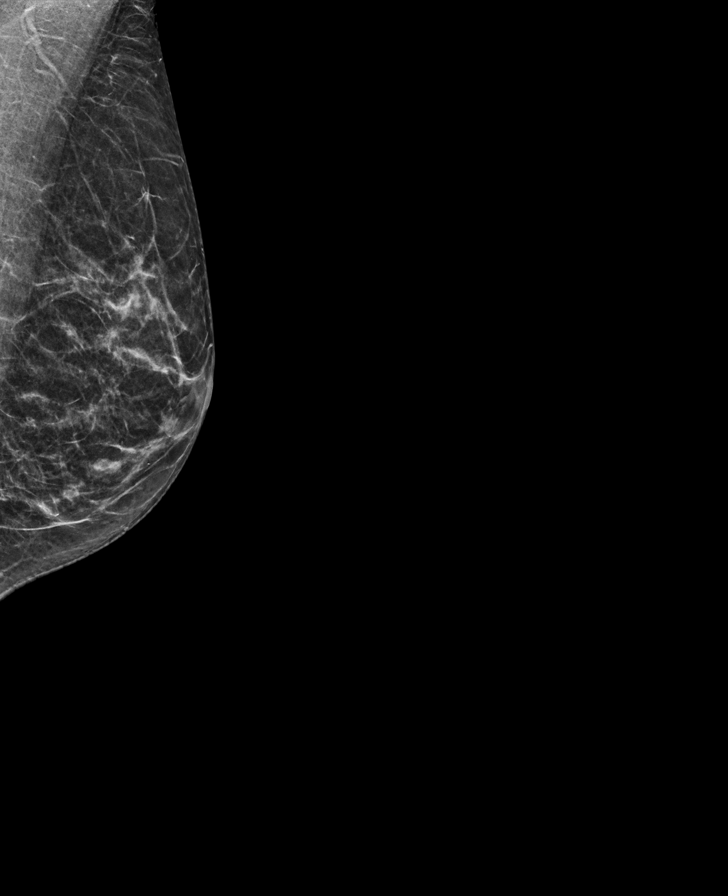

[R MLO synth-2D]
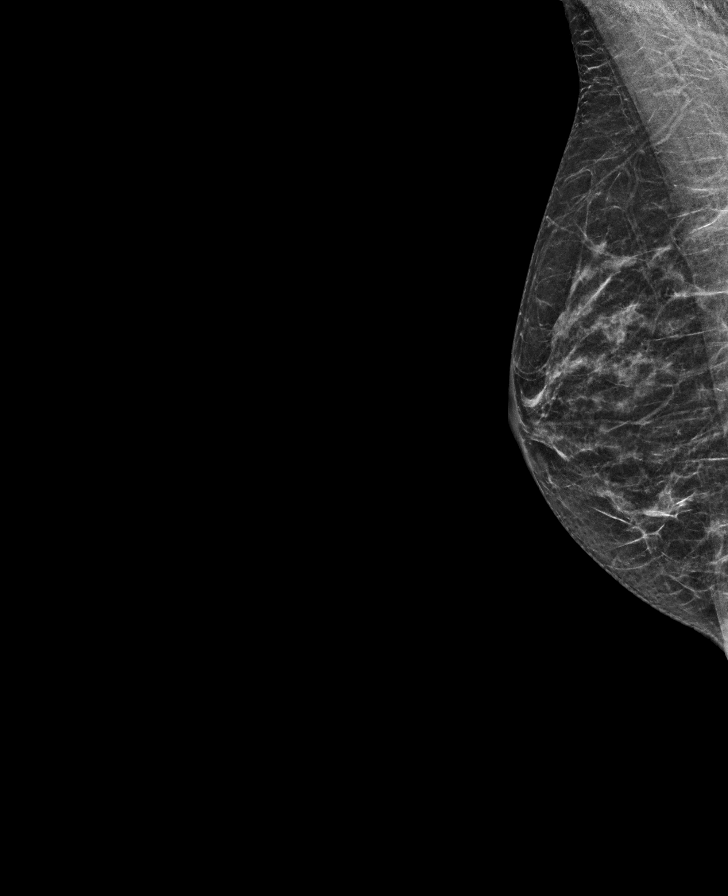

[R CC tomo · tomo slice 26/51.0]
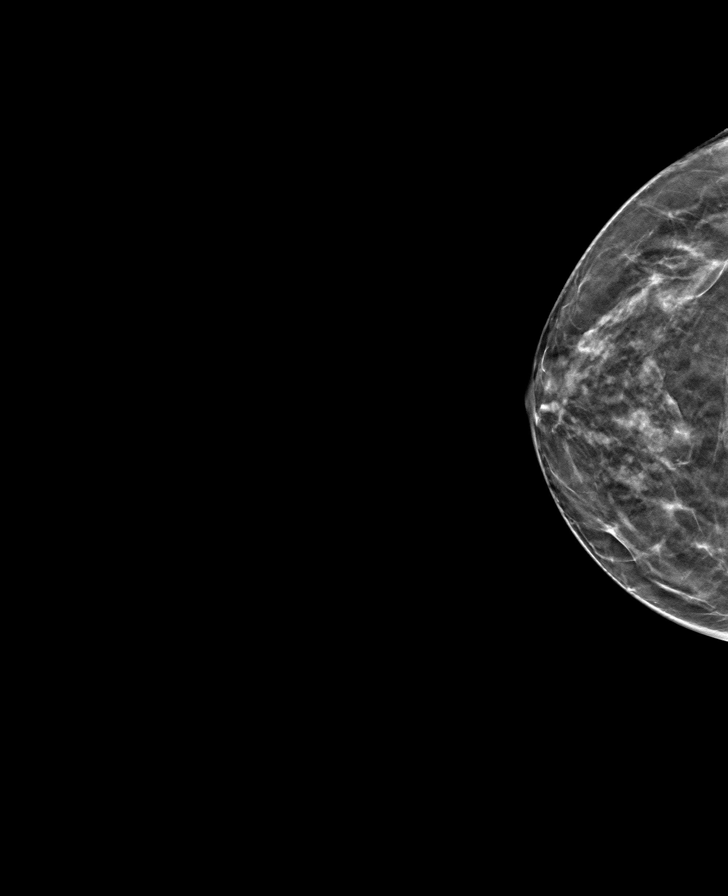

[L CC tomo · tomo slice 17/33.0]
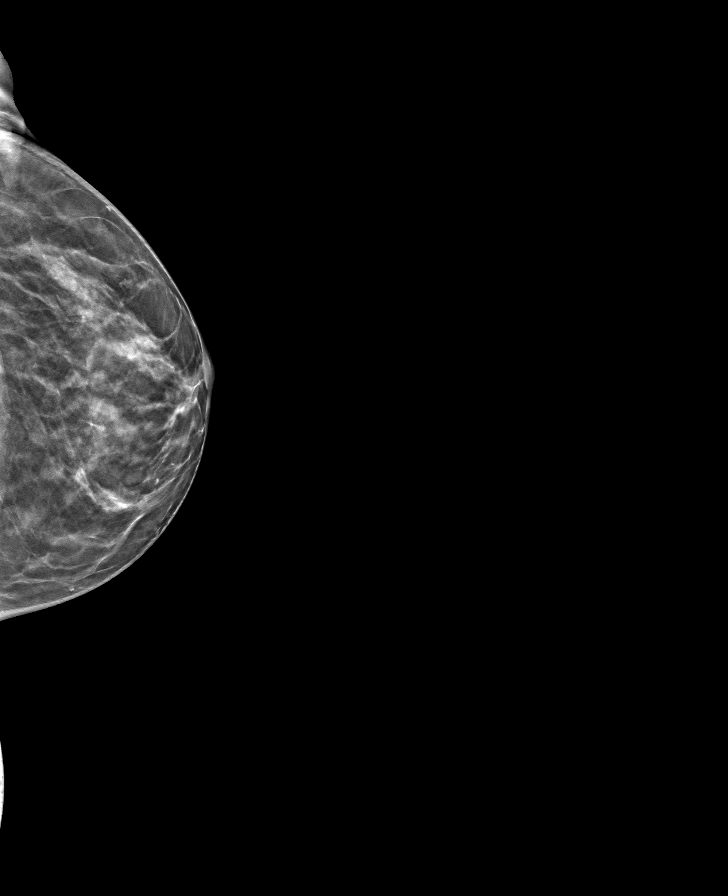

[L MLO tomo · tomo slice 24/47.0]
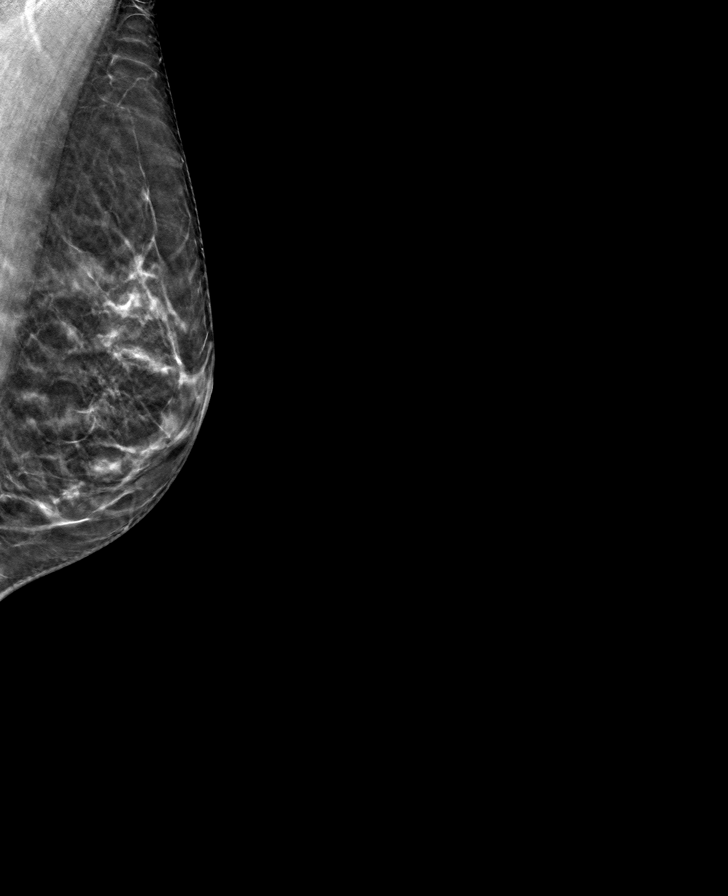

[R MLO tomo · tomo slice 25/49.0]
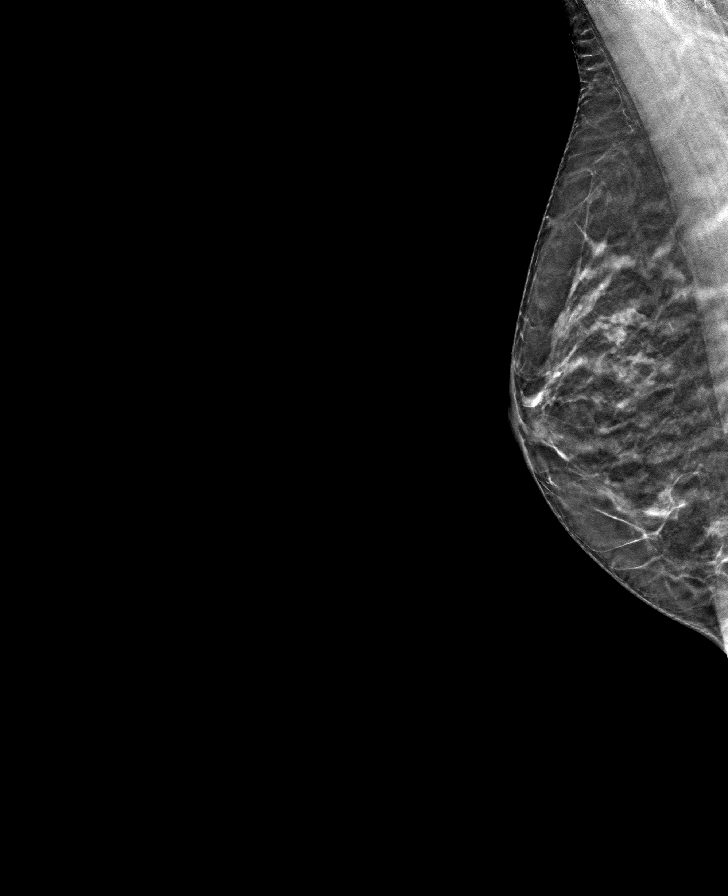

[8 of 24 positions shown; findings below may reference images not displayed]

ACR Breast Density Category b: There are scattered areas of
fibroglandular density.
FINDINGS: There are no findings suspicious for malignancy.
IMPRESSION: No mammographic evidence of malignancy. A result letter of this
screening mammogram will be mailed directly to the patient.

RECOMMENDATION:
Screening mammogram in one year. (Code:51-O-LD2)

BI-RADS CATEGORY  1: Negative.

## 2022-06-19 ENCOUNTER — Ambulatory Visit (INDEPENDENT_AMBULATORY_CARE_PROVIDER_SITE_OTHER): Payer: BC Managed Care – PPO | Admitting: Family Medicine

## 2022-06-19 VITALS — BP 131/77 | HR 78

## 2022-06-19 DIAGNOSIS — Z23 Encounter for immunization: Secondary | ICD-10-CM

## 2022-06-19 NOTE — Progress Notes (Signed)
Patient is here for her second Shingrix vaccine. Verified no previous allergy or problems with first Shingrix vaccine. Shingrix injection to left deltoid with no apparent complications. Patient advised to call with any problems.

## 2022-06-19 NOTE — Progress Notes (Signed)
Medical screening examination/treatment was performed by qualified clinical staff member and as supervising physician I was immediately available for consultation/collaboration. I have reviewed documentation and agree with assessment and plan.  Woody Kronberg, DO  

## 2022-06-23 ENCOUNTER — Telehealth: Payer: Self-pay | Admitting: Internal Medicine

## 2022-06-23 NOTE — Telephone Encounter (Signed)
Hi Dr. Lorenso Courier,  This patient is requesting a transfer of care to you.  She has moved from [place] and had her last colonoscopy in 2018.  Her records are in [place], but they are hidden in her internal medicine records.  I found them and printed them out and will forward them to you for your review.  Please let me know if you approve the transfer of care.  Thank you.

## 2022-06-25 ENCOUNTER — Encounter: Payer: Self-pay | Admitting: Internal Medicine

## 2022-06-25 NOTE — Telephone Encounter (Signed)
Patient was scheduled for PV on 8/10 at 2:30 and colonoscopy on 8/31

## 2022-07-14 ENCOUNTER — Encounter: Payer: Self-pay | Admitting: Medical-Surgical

## 2022-07-23 NOTE — Telephone Encounter (Signed)
Sent referral to [facility] [place]

## 2022-07-24 ENCOUNTER — Other Ambulatory Visit: Payer: Self-pay

## 2022-07-24 ENCOUNTER — Ambulatory Visit (AMBULATORY_SURGERY_CENTER): Payer: Self-pay | Admitting: *Deleted

## 2022-07-24 VITALS — Ht 62.0 in | Wt 130.0 lb

## 2022-07-24 DIAGNOSIS — Z86010 Personal history of colonic polyps: Secondary | ICD-10-CM

## 2022-07-24 MED ORDER — SUTAB 1479-225-188 MG PO TABS: 1.0000 | ORAL_TABLET | Freq: Once | ORAL | 0 refills | Status: AC

## 2022-07-24 NOTE — Progress Notes (Signed)
Pre visit completed in person. Patient requests Sutab due to previous experience and had good results and tolerated it better. Coupon provided  No egg or soy allergy known to patient  No issues known to pt with past sedation with any surgeries or procedures Patient denies ever being told they had issues or difficulty with intubation  No FH of Malignant Hyperthermia Pt is not on diet pills Pt is not on  home 02  Pt is not on blood thinners  Pt denies issues with constipation  No A fib or A flutter  Pt instructed to use [email or web address].com or GoodRx for a price reduction on prep

## 2022-07-28 ENCOUNTER — Other Ambulatory Visit: Payer: Self-pay | Admitting: Medical-Surgical

## 2022-07-28 MED ORDER — YUVAFEM 10 MCG VA TABS: 1.0000 | ORAL_TABLET | VAGINAL | 1 refills | Status: DC

## 2022-08-06 ENCOUNTER — Encounter: Payer: Self-pay | Admitting: Internal Medicine

## 2022-08-06 ENCOUNTER — Ambulatory Visit: Payer: BC Managed Care – PPO | Admitting: Physician Assistant

## 2022-08-14 ENCOUNTER — Encounter: Payer: Self-pay | Admitting: Internal Medicine

## 2022-08-14 ENCOUNTER — Ambulatory Visit (AMBULATORY_SURGERY_CENTER): Payer: BC Managed Care – PPO | Admitting: Internal Medicine

## 2022-08-14 VITALS — BP 115/76 | HR 79 | Temp 99.1°F | Resp 13 | Ht 62.0 in | Wt 130.0 lb

## 2022-08-14 DIAGNOSIS — Z09 Encounter for follow-up examination after completed treatment for conditions other than malignant neoplasm: Secondary | ICD-10-CM

## 2022-08-14 DIAGNOSIS — D122 Benign neoplasm of ascending colon: Secondary | ICD-10-CM | POA: Diagnosis not present

## 2022-08-14 DIAGNOSIS — Z86010 Personal history of colonic polyps: Secondary | ICD-10-CM

## 2022-08-14 DIAGNOSIS — Z1211 Encounter for screening for malignant neoplasm of colon: Secondary | ICD-10-CM | POA: Diagnosis not present

## 2022-08-14 MED ORDER — SODIUM CHLORIDE 0.9 % IV SOLN: 500.0000 mL | Freq: Once | INTRAVENOUS | Status: DC

## 2022-08-14 NOTE — Op Note (Signed)
[facility] Patient Name: Kelly Holmes Procedure Date: 08/14/2022 9:29 AM MRN: [id number] Endoscopist: Sonny Masters "Kelly Holmes ,  Age: 56 Referring MD:  Date of Birth: 13-Oct-1966 Gender: Female Account #: [id number] Procedure:                Colonoscopy Indications:              High risk colon cancer surveillance: Personal                            history of colonic polyps Medicines:                Monitored Anesthesia Care Procedure:                Pre-Anesthesia Assessment:                           - Prior to the procedure, a History and Physical                            was performed, and patient medications and                            allergies were reviewed. The patient's tolerance of                            previous anesthesia was also reviewed. The risks                            and benefits of the procedure and the sedation                            options and risks were discussed with the patient.                            All questions were answered, and informed consent                            was obtained. Prior Anticoagulants: The patient has                            taken no previous anticoagulant or antiplatelet                            agents. ASA Grade Assessment: II - A patient with                            mild systemic disease. After reviewing the risks                            and benefits, the patient was deemed in                            satisfactory condition to undergo the procedure.  After obtaining informed consent, the colonoscope                            was passed under direct vision. Throughout the                            procedure, the patient's blood pressure, pulse, and                            oxygen saturations were monitored continuously. The                            CF [id number] #[id number] was introduced through the anus                            and advanced to the the terminal  ileum. The                            colonoscopy was performed without difficulty. The                            patient tolerated the procedure well. The quality                            of the bowel preparation was excellent. The                            terminal ileum, ileocecal valve, appendiceal                            orifice, and rectum were photographed. Scope In: 9:34:21 AM Scope Out: 9:52:02 AM Scope Withdrawal Time: 0 hours 9 minutes 32 seconds  Total Procedure Duration: 0 hours 17 minutes 41 seconds  Findings:                 The terminal ileum appeared normal.                           Two sessile polyps were found in the ascending                            colon. The polyps were 3 to 4 mm in size. These                            polyps were removed with a cold snare. Resection                            and retrieval were complete.                           Non-bleeding internal hemorrhoids were found during                            retroflexion. Complications:  No immediate complications. Estimated Blood Loss:     Estimated blood loss was minimal. Impression:               - The examined portion of the ileum was normal.                           - Two 3 to 4 mm polyps in the ascending colon,                            removed with a cold snare. Resected and retrieved.                           - Non-bleeding internal hemorrhoids. Recommendation:           - Discharge patient to home (with escort).                           - Await pathology results.                           - The findings and recommendations were discussed                            with the patient. Dr Georgian Co "[place]" Marrero,  08/14/2022 10:11:01 AM

## 2022-08-14 NOTE — Progress Notes (Signed)
To pacu, VSS. Report to [email or web address].tb 

## 2022-08-14 NOTE — Progress Notes (Signed)
GASTROENTEROLOGY PROCEDURE H&P NOTE   Primary Care Physician: Samuel Bouche, NP    Reason for Procedure:   History of colon polyps  Plan:    Colonoscopy  Patient is appropriate for endoscopic procedure(s) in the ambulatory ([facility]) setting.  The nature of the procedure, as well as the risks, benefits, and alternatives were carefully and thoroughly reviewed with the patient. Ample time for discussion and questions allowed. The patient understood, was satisfied, and agreed to proceed.     HPI: Kelly Holmes is a 56 y.o. female who presents for colonoscopy for history of colon polyps. Denies blood in stools, changes in bowel habits, weight loss. Denies family history of colon cancer.  Past Medical History:  Diagnosis Date   Anxiety    Arthritis 2013   Colon polyps    Femoroacetabular impingement of right hip    Kidney stone    Plantar fasciitis    Thyroid disease     Past Surgical History:  Procedure Laterality Date   BUNIONECTOMY     TUBAL LIGATION      Prior to Admission medications   Medication Sig Start Date End Date Taking? Authorizing Provider  ALPRAZolam (XANAX) 0.25 MG tablet Take 1 tablet (0.25 mg total) by mouth daily as needed. 04/03/22  Yes Samuel Bouche, NP  Cholecalciferol (VITAMIN D) 50 MCG (2000 UT) tablet Take 2,000 Units by mouth daily.   Yes [provider]  clotrimazole-betamethasone (LOTRISONE) cream Apply 1 application topically 2 (two) times daily as needed. Do not use for more than 14 consecutive days. 01/02/22  Yes Samuel Bouche, NP  Docosahexaenoic Acid (DHA COMPLETE PO) Take 830 mg by mouth daily.   Yes [provider]  levothyroxine (SYNTHROID) 50 MCG tablet Take 1 tablet (50 mcg total) by mouth daily. 01/30/22  Yes Jessup, Joy, NP  melatonin 5 MG TABS Take 5 mg by mouth at bedtime as needed.   Yes [provider]  Probiotic Product (PROBIOTIC PO) Take by mouth daily.   Yes [provider]  UNABLE TO FIND Med Name:  Plexus Bio Cleanse    [provider]  YUVAFEM 10 MCG TABS vaginal tablet Place 1 tablet (10 mcg total) vaginally 2 (two) times a week. 07/28/22   Samuel Bouche, NP    Current Outpatient Medications  Medication Sig Dispense Refill   ALPRAZolam (XANAX) 0.25 MG tablet Take 1 tablet (0.25 mg total) by mouth daily as needed. 30 tablet 0   Cholecalciferol (VITAMIN D) 50 MCG (2000 UT) tablet Take 2,000 Units by mouth daily.     clotrimazole-betamethasone (LOTRISONE) cream Apply 1 application topically 2 (two) times daily as needed. Do not use for more than 14 consecutive days. 30 g 1   Docosahexaenoic Acid (DHA COMPLETE PO) Take 830 mg by mouth daily.     levothyroxine (SYNTHROID) 50 MCG tablet Take 1 tablet (50 mcg total) by mouth daily. 90 tablet 3   melatonin 5 MG TABS Take 5 mg by mouth at bedtime as needed.     Probiotic Product (PROBIOTIC PO) Take by mouth daily.     UNABLE TO FIND Med Name: Plexus Bio Cleanse     YUVAFEM 10 MCG TABS vaginal tablet Place 1 tablet (10 mcg total) vaginally 2 (two) times a week. 24 tablet 1   Current Facility-Administered Medications  Medication Dose Route Frequency Provider Last Rate Last Admin   0.9 %  sodium chloride infusion  500 mL Intravenous Once Sharyn Creamer, MD  Allergies as of 08/14/2022 - Review Complete 08/14/2022  Allergen Reaction Noted   Codeine Hives and Nausea And Vomiting 05/09/2021   Naproxen Hives 07/11/1999   Hydrocodone-acetaminophen Hives 07/11/1999    Family History  Problem Relation Age of Onset   Healthy Mother    Arthritis Mother    Depression Mother    Colon polyps Father    Hypertension Father    Heart attack Father    Diabetes Father    Cancer Father    Skin cancer Sister    Cancer Sister    Hypertension Sister    Stomach cancer Maternal Grandfather    Cancer Maternal Grandfather    Cancer Paternal Grandfather    ADD / ADHD Daughter    Colon cancer Neg Hx    Heart disease Neg Hx     Social  History   Socioeconomic History   Marital status: Married    Spouse name: Not on file   Number of children: Not on file   Years of education: Not on file   Highest education level: Not on file  Occupational History   Not on file  Tobacco Use   Smoking status: Never   Smokeless tobacco: Never  Vaping Use   Vaping Use: Never used  Substance and Sexual Activity   Alcohol use: Yes    Alcohol/week: 2.0 standard drinks of alcohol    Types: 2 Standard drinks or equivalent per week   Drug use: Never   Sexual activity: Yes    Partners: Male    Birth control/protection: Post-menopausal    Comment: tubal ligation  Other Topics Concern   Not on file  Social History Narrative   Not on file   Social Determinants of Health   Financial Resource Strain: Not on file  Food Insecurity: Not on file  Transportation Needs: Not on file  Physical Activity: Not on file  Stress: Not on file  Social Connections: Not on file  Intimate Partner Violence: Not on file    Physical Exam: Vital signs in last 24 hours: BP (!) 147/79   Pulse (!) 108   Temp 99.1 F (37.3 C) (Skin)   Ht '5\' 2"'$  (1.575 m)   Wt 130 lb (59 kg)   SpO2 99%   BMI 23.78 kg/m  GEN: NAD EYE: Sclerae anicteric ENT: MMM CV: Non-tachycardic Pulm: No increased work of breathing GI: Soft, NT/ND NEURO:  Alert & Oriented   Christia Reading, MD [facility]  08/14/2022 8:39 AM

## 2022-08-14 NOTE — Patient Instructions (Signed)
Please see handouts given to you on Polyps and Diverticulosis.    YOU HAD AN ENDOSCOPIC PROCEDURE TODAY AT [facility]:   Refer to the procedure report that was given to you for any specific questions about what was found during the examination.  If the procedure report does not answer your questions, please call your gastroenterologist to clarify.  If you requested that your care partner not be given the details of your procedure findings, then the procedure report has been included in a sealed envelope for you to review at your convenience later.  YOU SHOULD EXPECT: Some feelings of bloating in the abdomen. Passage of more gas than usual.  Walking can help get rid of the air that was put into your GI tract during the procedure and reduce the bloating. If you had a lower endoscopy (such as a colonoscopy or flexible sigmoidoscopy) you may notice spotting of blood in your stool or on the toilet paper. If you underwent a bowel prep for your procedure, you may not have a normal bowel movement for a few days.  Please Note:  You might notice some irritation and congestion in your nose or some drainage.  This is from the oxygen used during your procedure.  There is no need for concern and it should clear up in a day or so.  SYMPTOMS TO REPORT IMMEDIATELY:  Following lower endoscopy (colonoscopy or flexible sigmoidoscopy):  Excessive amounts of blood in the stool  Significant tenderness or worsening of abdominal pains  Swelling of the abdomen that is new, acute  Fever of 100F or higher   For urgent or emergent issues, a gastroenterologist can be reached at any hour by calling [phone]. Do not use MyChart messaging for urgent concerns.    DIET:  We do recommend a small meal at first, but then you may proceed to your regular diet.  Drink plenty of fluids but you should avoid alcoholic beverages for 24 hours.  ACTIVITY:  You should plan to take it easy for the rest of today and  you should NOT DRIVE or use heavy machinery until tomorrow (because of the sedation medicines used during the test).    FOLLOW UP: Our staff will call the number listed on your records the next business day following your procedure.  We will call around 7:15- 8:00 am to check on you and address any questions or concerns that you may have regarding the information given to you following your procedure. If we do not reach you, we will leave a message.  If you develop any symptoms (ie: fever, flu-like symptoms, shortness of breath, cough etc.) before then, please call [phone].  If you test positive for Covid 19 in the 2 weeks post procedure, please call and report this information to [place].    If any biopsies were taken you will be contacted by phone or by letter within the next 1-3 weeks.  Please call us at [phone] if you have not heard about the biopsies in 3 weeks.    SIGNATURES/CONFIDENTIALITY: You and/or your care partner have signed paperwork which will be entered into your electronic medical record.  These signatures attest to the fact that that the information above on your After Visit Summary has been reviewed and is understood.  Full responsibility of the confidentiality of this discharge information lies with you and/or your care-partner.

## 2022-08-14 NOTE — Progress Notes (Signed)
VS by EW  Pt's states no medical or surgical changes since previsit or office visit. 147/79

## 2022-08-14 NOTE — Progress Notes (Signed)
Called to room to assist during endoscopic procedure.  Patient ID and intended procedure confirmed with present staff. Received instructions for my participation in the procedure from the performing physician.  

## 2022-08-15 ENCOUNTER — Telehealth: Payer: Self-pay | Admitting: *Deleted

## 2022-08-15 NOTE — Telephone Encounter (Signed)
Follow up call attempt.  LVM advised to return call if any questions or concerns.

## 2022-08-19 ENCOUNTER — Encounter: Payer: Self-pay | Admitting: Internal Medicine

## 2022-10-20 ENCOUNTER — Other Ambulatory Visit: Payer: Self-pay | Admitting: Medical-Surgical

## 2022-10-20 MED ORDER — ALPRAZOLAM 0.25 MG PO TABS: 0.2500 mg | ORAL_TABLET | Freq: Every day | ORAL | 0 refills | Status: DC | PRN

## 2022-11-14 ENCOUNTER — Encounter: Payer: Self-pay | Admitting: Emergency Medicine

## 2022-11-14 ENCOUNTER — Ambulatory Visit
Admission: EM | Admit: 2022-11-14 | Discharge: 2022-11-14 | Disposition: A | Discharge status: Home / Self Care | Payer: BC Managed Care – PPO | Attending: Family Medicine | Admitting: Family Medicine

## 2022-11-14 DIAGNOSIS — L739 Follicular disorder, unspecified: Secondary | ICD-10-CM

## 2022-11-14 MED ORDER — DOXYCYCLINE HYCLATE 100 MG PO CAPS
100.0000 mg | ORAL_CAPSULE | Freq: Two times a day (BID) | ORAL | 0 refills | Status: AC
Start: 2022-11-14 — End: 2022-11-21

## 2022-11-14 NOTE — ED Provider Notes (Signed)
[facility]    CSN: [id number] Arrival date & time: 11/14/22  1751      History   Chief Complaint Chief Complaint  Patient presents with   Insect Bite    HPI Kelly Holmes is a 55 y.o. female.   HPI  Past Medical History:  Diagnosis Date   Anxiety    Arthritis 2013   Colon polyps    Femoroacetabular impingement of right hip    Kidney stone    Plantar fasciitis    Thyroid disease     Patient Active Problem List   Diagnosis Date Noted   Acquired hypothyroidism 01/02/2022   Plantar fasciitis 12/28/2019   Colon adenomas 04/10/2017   Claustrophobia 10/07/2016   Enthesopathy of hip region 08/27/2012    Past Surgical History:  Procedure Laterality Date   BUNIONECTOMY     TUBAL LIGATION      OB History   No obstetric history on file.      Home Medications    Prior to Admission medications   Medication Sig Start Date End Date Taking? Authorizing Provider  ALPRAZolam (XANAX) 0.25 MG tablet Take 1 tablet (0.25 mg total) by mouth daily as needed. 10/20/22  Yes Samuel Bouche, NP  Cholecalciferol (VITAMIN D) 50 MCG (2000 UT) tablet Take 2,000 Units by mouth daily.   Yes [provider]  clotrimazole-betamethasone (LOTRISONE) cream Apply 1 application topically 2 (two) times daily as needed. Do not use for more than 14 consecutive days. 01/02/22  Yes Samuel Bouche, NP  Docosahexaenoic Acid (DHA COMPLETE PO) Take 830 mg by mouth daily.   Yes [provider]  doxycycline (VIBRAMYCIN) 100 MG capsule Take 1 capsule (100 mg total) by mouth 2 (two) times daily for 7 days. 11/14/22 11/21/22 Yes Eliezer Lofts, FNP  levothyroxine (SYNTHROID) 50 MCG tablet Take 1 tablet (50 mcg total) by mouth daily. 01/30/22  Yes Jessup, Joy, NP  melatonin 5 MG TABS Take 5 mg by mouth at bedtime as needed.   Yes [provider]  Probiotic Product (PROBIOTIC PO) Take by mouth daily.   Yes [provider]  UNABLE TO FIND Med Name: Plexus Bio Cleanse    Yes [provider]  YUVAFEM 10 MCG TABS vaginal tablet Place 1 tablet (10 mcg total) vaginally 2 (two) times a week. 07/28/22  Yes Samuel Bouche, NP    Family History Family History  Problem Relation Age of Onset   Healthy Mother    Arthritis Mother    Depression Mother    Colon polyps Father    Hypertension Father    Heart attack Father    Diabetes Father    Cancer Father    Skin cancer Sister    Cancer Sister    Hypertension Sister    Stomach cancer Maternal Grandfather    Cancer Maternal Grandfather    Cancer Paternal Grandfather    ADD / ADHD Daughter    Colon cancer Neg Hx    Heart disease Neg Hx     Social History Social History   Tobacco Use   Smoking status: Never   Smokeless tobacco: Never  Vaping Use   Vaping Use: Never used  Substance Use Topics   Alcohol use: Yes    Alcohol/week: 2.0 standard drinks of alcohol    Types: 2 Standard drinks or equivalent per week   Drug use: Never     Allergies   Codeine, Naproxen, and Hydrocodone-acetaminophen   Review of Systems Review of Systems   Physical  Exam Triage Vital Signs ED Triage Vitals  Enc Vitals Group     BP 11/14/22 1757 (!) 146/75     Pulse Rate 11/14/22 1757 83     Resp 11/14/22 1757 18     Temp 11/14/22 1757 99.2 F (37.3 C)     Temp Source 11/14/22 1757 Oral     SpO2 11/14/22 1757 99 %     Weight 11/14/22 1758 130 lb (59 kg)     Height 11/14/22 1758 '5\' 2"'$  (1.575 m)     Head Circumference --      Peak Flow --      Pain Score 11/14/22 1758 0     Pain Loc --      Pain Edu? --      Excl. in [place]? --    No data found.  Updated Vital Signs BP (!) 146/75 (BP Location: Right Arm)   Pulse 83   Temp 99.2 F (37.3 C) (Oral)   Resp 18   Ht '5\' 2"'$  (1.575 m)   Wt 130 lb (59 kg)   SpO2 99%   BMI 23.78 kg/m   Visual Acuity Right Eye Distance:   Left Eye Distance:   Bilateral Distance:    Right Eye Near:   Left Eye Near:    Bilateral Near:     Physical Exam Vitals reviewed.   Constitutional:      General: She is not in acute distress.    Appearance: Normal appearance. She is normal weight. She is not ill-appearing.  HENT:     Head: Normocephalic and atraumatic.     Mouth/Throat:     Mouth: Mucous membranes are moist.     Pharynx: Oropharynx is clear.  Eyes:     Extraocular Movements: Extraocular movements intact.     Conjunctiva/sclera: Conjunctivae normal.     Pupils: Pupils are equal, round, and reactive to light.  Cardiovascular:     Rate and Rhythm: Normal rate and regular rhythm.     Pulses: Normal pulses.     Heart sounds: Normal heart sounds.  Pulmonary:     Effort: Pulmonary effort is normal.     Breath sounds: Normal breath sounds. No wheezing, rhonchi or rales.  Musculoskeletal:        General: Normal range of motion.     Cervical back: Normal range of motion and neck supple.  Skin:    General: Skin is warm and dry.     Comments: Mid back:~1.5 x 1.5 cm oval-shaped erythematous, indurated, nonfluctuant cyst noted-see image below  Neurological:     General: No focal deficit present.     Mental Status: She is alert and oriented to person, place, and time.      UC Treatments / Results  Labs (all labs ordered are listed, but only abnormal results are displayed) Labs Reviewed - No data to display  EKG   Radiology No results found.  Procedures Procedures (including critical care time)  Medications Ordered in UC Medications - No data to display  Initial Impression / Assessment and Plan / UC Course  I have reviewed the triage vital signs and the nursing notes.  Pertinent labs & imaging results that were available during my care of the patient were reviewed by me and considered in my medical decision making (see chart for details).     MDM: 1.  Folliculitis-Rx'd Doxycycline. Advised patient to take medication as directed with food to completion.  Encouraged patient increase daily water intake to 64 ounces  per day while taking this  medication.  Advised if symptoms worsen and/or unresolved please follow-up with PCP or here for further evaluation. Final Clinical Impressions(s) / UC Diagnoses   Final diagnoses:  Folliculitis     Discharge Instructions      Advised patient to take medication as directed with food to completion.  Encouraged patient increase daily water intake to 64 ounces per day while taking this medication.  Advised if symptoms worsen and/or unresolved please follow-up with PCP or here for further evaluation.     ED Prescriptions     Medication Sig Dispense Auth. Provider   doxycycline (VIBRAMYCIN) 100 MG capsule Take 1 capsule (100 mg total) by mouth 2 (two) times daily for 7 days. 14 capsule Eliezer Lofts, FNP      PDMP not reviewed this encounter.   Eliezer Lofts, [place] 11/14/22 1821

## 2022-11-14 NOTE — ED Triage Notes (Signed)
Patient unsure if she was bitten by something x 2 days on her mid back.  The area is painful and increased in size.  The area is red and tender to touch.  Patient denies any OTC pain meds.

## 2022-11-14 NOTE — Discharge Instructions (Addendum)
Advised patient to take medication as directed with food to completion.  Encouraged patient increase daily water intake to 64 ounces per day while taking this medication.  Advised if symptoms worsen and/or unresolved please follow-up with PCP or here for further evaluation.

## 2022-11-22 ENCOUNTER — Encounter: Payer: Self-pay | Admitting: Medical-Surgical

## 2023-01-16 ENCOUNTER — Encounter: Payer: BC Managed Care – PPO | Admitting: Medical-Surgical

## 2023-01-23 ENCOUNTER — Encounter: Payer: Self-pay | Admitting: Medical-Surgical

## 2023-01-23 ENCOUNTER — Ambulatory Visit (INDEPENDENT_AMBULATORY_CARE_PROVIDER_SITE_OTHER): Payer: BC Managed Care – PPO | Admitting: Medical-Surgical

## 2023-01-23 VITALS — BP 124/77 | HR 84 | Resp 20 | Ht 62.0 in | Wt 135.9 lb

## 2023-01-23 DIAGNOSIS — E559 Vitamin D deficiency, unspecified: Secondary | ICD-10-CM | POA: Diagnosis not present

## 2023-01-23 DIAGNOSIS — Z8639 Personal history of other endocrine, nutritional and metabolic disease: Secondary | ICD-10-CM

## 2023-01-23 DIAGNOSIS — Z13220 Encounter for screening for lipoid disorders: Secondary | ICD-10-CM | POA: Diagnosis not present

## 2023-01-23 DIAGNOSIS — F419 Anxiety disorder, unspecified: Secondary | ICD-10-CM | POA: Diagnosis not present

## 2023-01-23 DIAGNOSIS — E039 Hypothyroidism, unspecified: Secondary | ICD-10-CM

## 2023-01-23 DIAGNOSIS — Z23 Encounter for immunization: Secondary | ICD-10-CM

## 2023-01-23 DIAGNOSIS — Z0000 Encounter for general adult medical examination without abnormal findings: Secondary | ICD-10-CM

## 2023-01-23 DIAGNOSIS — Z1231 Encounter for screening mammogram for malignant neoplasm of breast: Secondary | ICD-10-CM

## 2023-01-23 MED ORDER — TRETINOIN 0.025 % EX CREA: 1.0000 | TOPICAL_CREAM | Freq: Every day | CUTANEOUS | 11 refills | Status: AC

## 2023-01-23 MED ORDER — YUVAFEM 10 MCG VA TABS: 1.0000 | ORAL_TABLET | VAGINAL | 1 refills | Status: DC

## 2023-01-23 MED ORDER — ALPRAZOLAM 0.25 MG PO TABS: 0.2500 mg | ORAL_TABLET | Freq: Every day | ORAL | 0 refills | Status: DC | PRN

## 2023-01-23 MED ORDER — LEVOTHYROXINE SODIUM 50 MCG PO TABS: 50.0000 ug | ORAL_TABLET | Freq: Every day | ORAL | 3 refills | Status: DC

## 2023-01-23 NOTE — Addendum Note (Signed)
Addended by: Beverlee Nims on: 01/23/2023 03:27 PM   Modules accepted: Orders

## 2023-01-23 NOTE — Progress Notes (Signed)
Complete physical exam  Patient: Kelly Holmes   DOB: 1966/01/11   57 y.o. Female  MRN: [id number]  Subjective:    Chief Complaint  Patient presents with   Annual Exam    Kelly Holmes is a 57 y.o. female who presents today for a complete physical exam. She reports consuming a general diet.  Does pilates and pickleball regularly.  She generally feels well. She reports sleeping fairly well. She does not have additional problems to discuss today.    Most recent fall risk assessment:    01/23/2023    2:46 PM  Shafer in the past year? 0  Number falls in past yr: 0  Injury with Fall? 0  Risk for fall due to : No Fall Risks  Follow up Falls evaluation completed     Most recent depression screenings:    01/23/2023    2:46 PM 01/02/2022   10:32 AM  PHQ 2/9 Scores  PHQ - 2 Score 0 0   Vision:Within last year and Dental: No current dental problems and Receives regular dental care    Patient Care Team: Samuel Bouche, NP as PCP - General (Nurse Practitioner)   Outpatient Medications Prior to Visit  Medication Sig   pimecrolimus (ELIDEL) 1 % cream Apply 1 Application topically 2 (two) times daily.   [DISCONTINUED] tretinoin (RETIN-A) 0.025 % cream Apply 1 % topically at bedtime.   Cholecalciferol (VITAMIN D) 50 MCG (2000 UT) tablet Take 2,000 Units by mouth daily.   clotrimazole-betamethasone (LOTRISONE) cream Apply 1 application topically 2 (two) times daily as needed. Do not use for more than 14 consecutive days.   Docosahexaenoic Acid (DHA COMPLETE PO) Take 830 mg by mouth daily.   melatonin 5 MG TABS Take 5 mg by mouth at bedtime as needed.   Probiotic Product (PROBIOTIC PO) Take by mouth daily.   UNABLE TO FIND Med Name: Plexus Bio Cleanse   [DISCONTINUED] ALPRAZolam (XANAX) 0.25 MG tablet Take 1 tablet (0.25 mg total) by mouth daily as needed.   [DISCONTINUED] levothyroxine (SYNTHROID) 50 MCG tablet Take 1 tablet (50 mcg total) by mouth daily.   [DISCONTINUED]  YUVAFEM 10 MCG TABS vaginal tablet Place 1 tablet (10 mcg total) vaginally 2 (two) times a week.   No facility-administered medications prior to visit.    Review of Systems  Constitutional:  Negative for chills, fever, malaise/fatigue and weight loss.  HENT:  Negative for congestion, ear pain, hearing loss, sinus pain and sore throat.   Eyes:  Negative for blurred vision, photophobia and pain.  Respiratory:  Negative for cough, shortness of breath and wheezing.   Cardiovascular:  Negative for chest pain, palpitations and leg swelling.  Gastrointestinal:  Negative for abdominal pain, constipation, diarrhea, heartburn, nausea and vomiting.  Genitourinary:  Negative for dysuria, frequency and urgency.  Musculoskeletal:  Negative for falls and neck pain.  Skin:  Positive for rash. Negative for itching.  Neurological:  Negative for dizziness, weakness and headaches.  Endo/Heme/Allergies:  Negative for polydipsia. Does not bruise/bleed easily.  Psychiatric/Behavioral:  Negative for depression, substance abuse and suicidal ideas. The patient is nervous/anxious and has insomnia (intermittent).      Objective:    BP 124/77   Pulse 84   Resp 20   Ht 5' 2"$  (1.575 m)   Wt 135 lb 14.4 oz (61.6 kg)   SpO2 98%   BMI 24.86 kg/m    Physical Exam   No results found for any visits on 01/23/23.  Assessment & Plan:    Routine Health Maintenance and Physical Exam  Immunization History  Administered Date(s) Administered   Influenza,inj,Quad PF,6+ Mos 10/30/2006, 10/06/2007, 09/19/2008, 09/28/2012, 09/15/2013, 09/16/2014, 10/10/2015, 09/29/2016, 09/07/2017, 09/20/2018, 09/05/2019, 10/06/2020   Influenza,inj,quad, With Preservative 09/05/2010   Influenza-Unspecified 10/12/2001, 10/14/2002, 10/23/2004, 10/10/2005, 10/30/2006, 10/06/2007, 09/19/2008, 09/05/2010, 08/19/2018, 09/14/2021, 10/19/2022   Moderna SARS-COV2 Booster Vaccination 12/11/2020, 09/14/2021   Moderna Sars-Covid-2 Vaccination  04/04/2020, 05/03/2020   Tdap 09/12/2011   Zoster Recombinat (Shingrix) 01/02/2022, 06/19/2022    Health Maintenance  Topic Date Due   DTaP/Tdap/Td (2 - Td or Tdap) 09/11/2021   COVID-19 Vaccine (5 - 2023-24 season) 02/08/2023 (Originally 08/15/2022)   PAP SMEAR-Modifier  11/15/2023   MAMMOGRAM  03/13/2024   COLONOSCOPY (Pts 45-55yr Insurance coverage will need to be confirmed)  08/14/2029   INFLUENZA VACCINE  Completed   Zoster Vaccines- Shingrix  Completed   HPV VACCINES  Aged Out   Hepatitis C Screening  Discontinued   HIV Screening  Discontinued    Discussed health benefits of physical activity, and encouraged her to engage in regular exercise appropriate for her age and condition.  1. Annual physical exam Checking labs as below.  Up-to-date on preventative care.  Wellness information provided with AVS. - CBC with Differential/Platelet - COMPLETE METABOLIC PANEL WITH GFR - Lipid panel  2. Anxiety Continue Xanax 0.25 mg daily as needed.  Sparing use to prevent tolerance and dependence.  3. Acquired hypothyroidism Checking TSH.  Continue levothyroxine 50 mcg daily. - TSH  4. Lipid screening Lipids today. - Lipid panel  5. History of vitamin D deficiency Checking vitamin D today. - VITAMIN D 25 Hydroxy (Vit-D Deficiency, Fractures)  6. Encounter for screening mammogram for malignant neoplasm of breast Mammogram ordered. - MM DIGITAL SCREENING BILATERAL; Future  Return in about 1 year (around 01/24/2024) for annual physical exam.   JSamuel Bouche NP

## 2023-01-24 LAB — LIPID PANEL
Cholesterol: 230 mg/dL — ABNORMAL HIGH (ref <200)
HDL: 98 mg/dL (ref >=50)
LDL Cholesterol (Calc): 112 mg/dL (calc) — ABNORMAL HIGH
Non-HDL Cholesterol (Calc): 132 mg/dL (calc) — ABNORMAL HIGH (ref <130)
Total CHOL/HDL Ratio: 2.3 (calc) (ref <5.0)
Triglycerides: 98 mg/dL (ref <150)

## 2023-01-24 LAB — COMPLETE METABOLIC PANEL WITH GFR
AG Ratio: 2.2 (calc) (ref 1.0–2.5)
ALT: 19 U/L (ref 6–29)
AST: 22 U/L (ref 10–35)
Albumin: 5.2 g/dL — ABNORMAL HIGH (ref 3.6–5.1)
Alkaline phosphatase (APISO): 80 U/L (ref 37–153)
BUN: 18 mg/dL (ref 7–25)
CO2: 28 mmol/L (ref 20–32)
Calcium: 9.6 mg/dL (ref 8.6–10.4)
Chloride: 103 mmol/L (ref 98–110)
Creat: 0.83 mg/dL (ref 0.50–1.03)
Globulin: 2.4 g/dL (calc) (ref 1.9–3.7)
Glucose, Bld: 89 mg/dL (ref 65–99)
Potassium: 4.2 mmol/L (ref 3.5–5.3)
Sodium: 140 mmol/L (ref 135–146)
Total Bilirubin: 0.4 mg/dL (ref 0.2–1.2)
Total Protein: 7.6 g/dL (ref 6.1–8.1)
eGFR: 83 mL/min/{1.73_m2} (ref >=60)

## 2023-01-24 LAB — CBC WITH DIFFERENTIAL/PLATELET
Absolute Monocytes: 348 cells/uL (ref 200–950)
Basophils Absolute: 17 cells/uL (ref 0–200)
Basophils Relative: 0.3 %
Eosinophils Absolute: 58 cells/uL (ref 15–500)
Eosinophils Relative: 1 %
HCT: 43.3 % (ref 35.0–45.0)
Hemoglobin: 14.7 g/dL (ref 11.7–15.5)
Lymphs Abs: 1902 cells/uL (ref 850–3900)
MCH: 29.2 pg (ref 27.0–33.0)
MCHC: 33.9 g/dL (ref 32.0–36.0)
MCV: 86.1 fL (ref 80.0–100.0)
MPV: 9.7 fL (ref 7.5–12.5)
Monocytes Relative: 6 %
Neutro Abs: 3474 cells/uL (ref 1500–7800)
Neutrophils Relative %: 59.9 %
Platelets: 334 10*3/uL (ref 140–400)
RBC: 5.03 10*6/uL (ref 3.80–5.10)
RDW: 12.5 % (ref 11.0–15.0)
Total Lymphocyte: 32.8 %
WBC: 5.8 10*3/uL (ref 3.8–10.8)

## 2023-01-24 LAB — VITAMIN D 25 HYDROXY (VIT D DEFICIENCY, FRACTURES): Vit D, 25-Hydroxy: 50 ng/mL (ref 30–100)

## 2023-01-24 LAB — TSH: TSH: 1.63 mIU/L (ref 0.40–4.50)

## 2023-01-27 ENCOUNTER — Other Ambulatory Visit: Payer: Self-pay

## 2023-01-27 DIAGNOSIS — E7800 Pure hypercholesterolemia, unspecified: Secondary | ICD-10-CM

## 2023-01-27 MED ORDER — ROSUVASTATIN CALCIUM 5 MG PO TABS: 5.0000 mg | ORAL_TABLET | Freq: Every day | ORAL | 3 refills | Status: DC

## 2023-01-27 NOTE — Addendum Note (Signed)
Addended bySamuel Bouche on: 01/27/2023 12:58 PM   Modules accepted: Orders

## 2023-02-02 ENCOUNTER — Encounter: Payer: Self-pay | Admitting: Medical-Surgical

## 2023-04-03 LAB — COMPLETE METABOLIC PANEL WITH GFR
AG Ratio: 1.7 (calc) (ref 1.0–2.5)
ALT: 15 U/L (ref 6–29)
AST: 18 U/L (ref 10–35)
Albumin: 4.5 g/dL (ref 3.6–5.1)
Alkaline phosphatase (APISO): 73 U/L (ref 37–153)
BUN: 16 mg/dL (ref 7–25)
CO2: 29 mmol/L (ref 20–32)
Calcium: 9.3 mg/dL (ref 8.6–10.4)
Chloride: 105 mmol/L (ref 98–110)
Creat: 0.91 mg/dL (ref 0.50–1.03)
Globulin: 2.6 g/dL (calc) (ref 1.9–3.7)
Glucose, Bld: 97 mg/dL (ref 65–99)
Potassium: 4 mmol/L (ref 3.5–5.3)
Sodium: 142 mmol/L (ref 135–146)
Total Bilirubin: 0.4 mg/dL (ref 0.2–1.2)
Total Protein: 7.1 g/dL (ref 6.1–8.1)
eGFR: 74 mL/min/{1.73_m2} (ref >=60)

## 2023-04-03 LAB — LIPID PANEL W/REFLEX DIRECT LDL
Cholesterol: 143 mg/dL (ref <200)
HDL: 81 mg/dL (ref >=50)
LDL Cholesterol (Calc): 48 mg/dL (calc)
Non-HDL Cholesterol (Calc): 62 mg/dL (calc) (ref <130)
Total CHOL/HDL Ratio: 1.8 (calc) (ref <5.0)
Triglycerides: 49 mg/dL (ref <150)

## 2023-04-24 ENCOUNTER — Encounter: Payer: Self-pay | Admitting: Medical-Surgical

## 2023-04-24 MED ORDER — LEVOTHYROXINE SODIUM 50 MCG PO TABS: 50.0000 ug | ORAL_TABLET | Freq: Every day | ORAL | 3 refills | Status: DC

## 2023-04-24 MED ORDER — ROSUVASTATIN CALCIUM 5 MG PO TABS: 5.0000 mg | ORAL_TABLET | Freq: Every day | ORAL | 3 refills | Status: DC

## 2023-05-13 ENCOUNTER — Ambulatory Visit: Payer: BC Managed Care – PPO

## 2023-06-02 ENCOUNTER — Ambulatory Visit (INDEPENDENT_AMBULATORY_CARE_PROVIDER_SITE_OTHER): Payer: 59

## 2023-06-02 DIAGNOSIS — Z1231 Encounter for screening mammogram for malignant neoplasm of breast: Secondary | ICD-10-CM | POA: Diagnosis not present

## 2023-09-21 ENCOUNTER — Other Ambulatory Visit: Payer: Self-pay | Admitting: Medical-Surgical

## 2023-09-22 MED ORDER — ALPRAZOLAM 0.25 MG PO TABS: 0.2500 mg | ORAL_TABLET | Freq: Every day | ORAL | 0 refills | Status: DC | PRN

## 2024-01-25 ENCOUNTER — Encounter: Payer: BC Managed Care – PPO | Admitting: Medical-Surgical

## 2024-02-26 ENCOUNTER — Ambulatory Visit (INDEPENDENT_AMBULATORY_CARE_PROVIDER_SITE_OTHER): Payer: BC Managed Care – PPO | Admitting: Medical-Surgical

## 2024-02-26 ENCOUNTER — Other Ambulatory Visit (HOSPITAL_COMMUNITY)
Admission: RE | Admit: 2024-02-26 | Discharge: 2024-02-26 | Disposition: A | Discharge status: Home / Self Care | Source: Ambulatory Visit | Attending: Medical-Surgical | Admitting: Medical-Surgical

## 2024-02-26 ENCOUNTER — Encounter: Payer: Self-pay | Admitting: Medical-Surgical

## 2024-02-26 ENCOUNTER — Telehealth: Payer: Self-pay

## 2024-02-26 VITALS — BP 127/78 | HR 93 | Resp 20 | Ht 62.0 in | Wt 132.2 lb

## 2024-02-26 DIAGNOSIS — E039 Hypothyroidism, unspecified: Secondary | ICD-10-CM

## 2024-02-26 DIAGNOSIS — Z124 Encounter for screening for malignant neoplasm of cervix: Secondary | ICD-10-CM | POA: Diagnosis not present

## 2024-02-26 DIAGNOSIS — Z0000 Encounter for general adult medical examination without abnormal findings: Secondary | ICD-10-CM

## 2024-02-26 DIAGNOSIS — M5412 Radiculopathy, cervical region: Secondary | ICD-10-CM

## 2024-02-26 DIAGNOSIS — Z13220 Encounter for screening for lipoid disorders: Secondary | ICD-10-CM | POA: Diagnosis not present

## 2024-02-26 MED ORDER — METHYLPREDNISOLONE 4 MG PO TBPK: ORAL_TABLET | ORAL | 0 refills | Status: AC

## 2024-02-26 MED ORDER — CELECOXIB 200 MG PO CAPS: 200.0000 mg | ORAL_CAPSULE | Freq: Two times a day (BID) | ORAL | 1 refills | Status: AC

## 2024-02-26 NOTE — Progress Notes (Signed)
 Complete physical exam  Patient: Kelly Holmes   DOB: Jul 11, 1966   58 y.o. Female  MRN: [id number]  Subjective:    Chief Complaint  Patient presents with   Annual Exam    Jodi Molitor is a 57 y.o. female who presents today for a complete physical exam. She reports consuming a general diet.  Doing pilates and pickleball.  She generally feels well. She reports sleeping well. She does not have additional problems to discuss today.    Most recent fall risk assessment:    01/23/2023    2:46 PM  Fall Risk   Falls in the past year? 0  Number falls in past yr: 0  Injury with Fall? 0  Risk for fall due to : No Fall Risks  Follow up Falls evaluation completed     Most recent depression screenings:    02/26/2024    2:15 PM 01/23/2023    2:46 PM  PHQ 2/9 Scores  PHQ - 2 Score 0 0    Vision:Within last year, Dental: No current dental problems and Receives regular dental care, and STD: The patient denies history of sexually transmitted disease.    Patient Care Team: Christen Butter, NP as PCP - General (Nurse Practitioner)   Outpatient Medications Prior to Visit  Medication Sig   ALPRAZolam (XANAX) 0.25 MG tablet Take 1 tablet (0.25 mg total) by mouth daily as needed.   Cholecalciferol (VITAMIN D) 50 MCG (2000 UT) tablet Take 2,000 Units by mouth daily.   clotrimazole-betamethasone (LOTRISONE) cream Apply 1 application topically 2 (two) times daily as needed. Do not use for more than 14 consecutive days.   Docosahexaenoic Acid (DHA COMPLETE PO) Take 830 mg by mouth daily.   levothyroxine (SYNTHROID) 50 MCG tablet Take 1 tablet (50 mcg total) by mouth daily.   melatonin 5 MG TABS Take 5 mg by mouth at bedtime as needed.   pimecrolimus (ELIDEL) 1 % cream Apply 1 Application topically 2 (two) times daily.   Probiotic Product (PROBIOTIC PO) Take by mouth daily.   rosuvastatin (CRESTOR) 5 MG tablet Take 1 tablet (5 mg total) by mouth daily.   tretinoin (RETIN-A) 0.025 % cream Apply 1  Application topically at bedtime.   UNABLE TO FIND Med Name: Plexus Bio Cleanse   YUVAFEM 10 MCG TABS vaginal tablet Place 1 tablet (10 mcg total) vaginally 2 (two) times a week.   [DISCONTINUED] methylPREDNISolone (MEDROL DOSEPAK) 4 MG TBPK tablet Take by mouth.   No facility-administered medications prior to visit.   Review of Systems  Constitutional:  Negative for chills, fever, malaise/fatigue and weight loss.  HENT:  Negative for congestion, ear pain, hearing loss, sinus pain and sore throat.   Eyes:  Negative for blurred vision, photophobia and pain.  Respiratory:  Negative for cough, shortness of breath and wheezing.   Cardiovascular:  Negative for chest pain, palpitations and leg swelling.  Gastrointestinal:  Negative for abdominal pain, constipation, diarrhea, heartburn, nausea and vomiting.  Genitourinary:  Negative for dysuria, frequency and urgency.  Musculoskeletal:  Positive for myalgias (pain in left shoulder blade with radiculopathy). Negative for falls and neck pain.  Skin:  Negative for itching and rash.  Neurological:  Positive for tingling. Negative for dizziness, weakness and headaches.  Endo/Heme/Allergies:  Positive for environmental allergies. Negative for polydipsia. Does not bruise/bleed easily.  Psychiatric/Behavioral:  Negative for depression, substance abuse and suicidal ideas. The patient is nervous/anxious.      Objective:     BP 127/78 (BP Location:  Left Arm, Cuff Size: Normal)   Pulse 93   Resp 20   Ht 5\' 2"  (1.575 m)   Wt 132 lb 3.2 oz (60 kg)   SpO2 100%   BMI 24.18 kg/m    Physical Exam Vitals reviewed. Exam conducted with a chaperone present.  Constitutional:      General: She is not in acute distress.    Appearance: Normal appearance. She is normal weight. She is not ill-appearing.  HENT:     Head: Normocephalic and atraumatic.     Right Ear: Tympanic membrane, ear canal and external ear normal. There is no impacted cerumen.     Left  Ear: Tympanic membrane, ear canal and external ear normal. There is no impacted cerumen.     Nose: Nose normal. No congestion or rhinorrhea.     Mouth/Throat:     Mouth: Mucous membranes are moist.     Pharynx: No oropharyngeal exudate or posterior oropharyngeal erythema.  Eyes:     General: No scleral icterus.       Right eye: No discharge.        Left eye: No discharge.     Extraocular Movements: Extraocular movements intact.     Conjunctiva/sclera: Conjunctivae normal.     Pupils: Pupils are equal, round, and reactive to light.  Neck:     Thyroid: No thyromegaly.     Vascular: No carotid bruit or JVD.     Trachea: Trachea normal.  Cardiovascular:     Rate and Rhythm: Normal rate and regular rhythm.     Pulses: Normal pulses.     Heart sounds: Normal heart sounds. No murmur heard.    No friction rub. No gallop.  Pulmonary:     Effort: Pulmonary effort is normal. No respiratory distress.     Breath sounds: Normal breath sounds. No wheezing.  Abdominal:     General: Bowel sounds are normal. There is no distension.     Palpations: Abdomen is soft.     Tenderness: There is no abdominal tenderness. There is no guarding.     Hernia: There is no hernia in the left inguinal area or right inguinal area.  Genitourinary:    General: Normal vulva.     Exam position: Lithotomy position.     Pubic Area: No rash.      Labia:        Right: No rash, tenderness, lesion or injury.        Left: No rash, tenderness, lesion or injury.      Urethra: No prolapse.     Vagina: Normal.     Cervix: Normal.     Uterus: Normal.      Adnexa: Right adnexa normal and left adnexa normal.  Musculoskeletal:        General: Normal range of motion.     Cervical back: Normal range of motion and neck supple.  Lymphadenopathy:     Cervical: No cervical adenopathy.     Lower Body: No right inguinal adenopathy. No left inguinal adenopathy.  Skin:    General: Skin is warm and dry.  Neurological:     Mental  Status: She is alert and oriented to person, place, and time.     Cranial Nerves: No cranial nerve deficit.  Psychiatric:        Mood and Affect: Mood normal.        Behavior: Behavior normal.        Thought Content: Thought content normal.  Judgment: Judgment normal.   No results found for any visits on 02/26/24.     Assessment & Plan:    Routine Health Maintenance and Physical Exam  Immunization History  Administered Date(s) Administered   Influenza,inj,Quad PF,6+ Mos 10/30/2006, 10/06/2007, 09/19/2008, 09/28/2012, 09/15/2013, 09/16/2014, 10/10/2015, 09/29/2016, 09/07/2017, 09/20/2018, 09/05/2019, 10/06/2020   Influenza,inj,quad, With Preservative 09/05/2010   Influenza-Unspecified 10/12/2001, 10/14/2002, 10/23/2004, 10/10/2005, 10/30/2006, 10/06/2007, 09/19/2008, 09/05/2010, 09/14/2021, 10/19/2022, 09/07/2023   Moderna Covid-19 Vaccine Bivalent Booster 54yrs & up 10/11/2023   Moderna SARS-COV2 Booster Vaccination 12/11/2020, 09/14/2021   Moderna Sars-Covid-2 Vaccination 04/04/2020, 05/03/2020   Tdap 09/12/2011, 01/23/2023   Zoster Recombinant(Shingrix) 01/02/2022, 06/19/2022    Health Maintenance  Topic Date Due   Cervical Cancer Screening (HPV/Pap Cotest)  11/15/2023   COVID-19 Vaccine (6 - 2024-25 season) 12/06/2023   MAMMOGRAM  06/01/2024   Colonoscopy  08/14/2029   DTaP/Tdap/Td (3 - Td or Tdap) 01/23/2033   INFLUENZA VACCINE  Completed   Zoster Vaccines- Shingrix  Completed   HPV VACCINES  Aged Out   Hepatitis C Screening  Discontinued   HIV Screening  Discontinued    Discussed health benefits of physical activity, and encouraged her to engage in regular exercise appropriate for her age and condition.  1. Annual physical exam (Primary) Checking labs as below. UTD on preventative care. Wellness information provided with AVS. - CBC with Differential/Platelet - CMP14+EGFR  2. Cervical cancer screening Pap smear completed with HPV cotesting. - Cytology -  PAP  3. Acquired hypothyroidism Checking TSH.  Continue levothyroxine 50 mcg daily. - TSH  4. Lipid screening Checking lipids.  Continue rosuvastatin 5 mg daily. - Lipid panel  5. Cervical radiculitis Increase in pain that radiates down the arm and is accompanied by paresthesias.  Treating with Medrol Dosepak.  Once that is done, recommend Celebrex 200 mg twice daily as needed.  Recommend home exercises/stretching.   Return in about 1 year (around 02/25/2025) for annual physical exam.     Christen Butter, NP

## 2024-02-26 NOTE — Patient Instructions (Signed)
 Preventive Care 16-57 Years Old, Female  Preventive care refers to lifestyle choices and visits with your health care provider that can promote health and wellness. Preventive care visits are also called wellness exams.  What can I expect for my preventive care visit?  Counseling  Your health care provider may ask you questions about your:  Medical history, including:  Past medical problems.  Family medical history.  Pregnancy history.  Current health, including:  Menstrual cycle.  Method of birth control.  Emotional well-being.  Home life and relationship well-being.  Sexual activity and sexual health.  Lifestyle, including:  Alcohol, nicotine or tobacco, and drug use.  Access to firearms.  Diet, exercise, and sleep habits.  Work and work Astronomer.  Sunscreen use.  Safety issues such as seatbelt and bike helmet use.  Physical exam  Your health care provider will check your:  Height and weight. These may be used to calculate your BMI (body mass index). BMI is a measurement that tells if you are at a healthy weight.  Waist circumference. This measures the distance around your waistline. This measurement also tells if you are at a healthy weight and may help predict your risk of certain diseases, such as type 2 diabetes and high blood pressure.  Heart rate and blood pressure.  Body temperature.  Skin for abnormal spots.  What immunizations do I need?    Vaccines are usually given at various ages, according to a schedule. Your health care provider will recommend vaccines for you based on your age, medical history, and lifestyle or other factors, such as travel or where you work.  What tests do I need?  Screening  Your health care provider may recommend screening tests for certain conditions. This may include:  Lipid and cholesterol levels.  Diabetes screening. This is done by checking your blood sugar (glucose) after you have not eaten for a while (fasting).  Pelvic exam and Pap test.  Hepatitis B test.  Hepatitis C  test.  HIV (human immunodeficiency virus) test.  STI (sexually transmitted infection) testing, if you are at risk.  Lung cancer screening.  Colorectal cancer screening.  Mammogram. Talk with your health care provider about when you should start having regular mammograms. This may depend on whether you have a family history of breast cancer.  BRCA-related cancer screening. This may be done if you have a family history of breast, ovarian, tubal, or peritoneal cancers.  Bone density scan. This is done to screen for osteoporosis.  Talk with your health care provider about your test results, treatment options, and if necessary, the need for more tests.  Follow these instructions at home:  Eating and drinking    Eat a diet that includes fresh fruits and vegetables, whole grains, lean protein, and low-fat dairy products.  Take vitamin and mineral supplements as recommended by your health care provider.  Do not drink alcohol if:  Your health care provider tells you not to drink.  You are pregnant, may be pregnant, or are planning to become pregnant.  If you drink alcohol:  Limit how much you have to 0-1 drink a day.  Know how much alcohol is in your drink. In the [place] one drink equals one 12 oz bottle of beer (355 mL), one 5 oz glass of wine (148 mL), or one 1 oz glass of hard liquor (44 mL).  Lifestyle  Brush your teeth every morning and night with fluoride toothpaste. Floss one time each day.  Exercise for at least  30 minutes 5 or more days each week.  Do not use any products that contain nicotine or tobacco. These products include cigarettes, chewing tobacco, and vaping devices, such as e-cigarettes. If you need help quitting, ask your health care provider.  Do not use drugs.  If you are sexually active, practice safe sex. Use a condom or other form of protection to prevent STIs.  If you do not wish to become pregnant, use a form of birth control. If you plan to become pregnant, see your health care provider for a  prepregnancy visit.  Take aspirin only as told by your health care provider. Make sure that you understand how much to take and what form to take. Work with your health care provider to find out whether it is safe and beneficial for you to take aspirin daily.  Find healthy ways to manage stress, such as:  Meditation, yoga, or listening to music.  Journaling.  Talking to a trusted person.  Spending time with friends and family.  Minimize exposure to UV radiation to reduce your risk of skin cancer.  Safety  Always wear your seat belt while driving or riding in a vehicle.  Do not drive:  If you have been drinking alcohol. Do not ride with someone who has been drinking.  When you are tired or distracted.  While texting.  If you have been using any mind-altering substances or drugs.  Wear a helmet and other protective equipment during sports activities.  If you have firearms in your house, make sure you follow all gun safety procedures.  Seek help if you have been physically or sexually abused.  What's next?  Visit your health care provider once a year for an annual wellness visit.  Ask your health care provider how often you should have your eyes and teeth checked.  Stay up to date on all vaccines.  This information is not intended to replace advice given to you by your health care provider. Make sure you discuss any questions you have with your health care provider.  Document Revised: 05/29/2021 Document Reviewed: 05/29/2021  Elsevier Patient Education  2024 [employer].

## 2024-02-26 NOTE — Telephone Encounter (Signed)
 Copied from CRM [id number]. Topic: Clinical - Medical Advice >> Feb 26, 2024  3:31 PM Nila Nephew wrote: Reason for CRM: Patient is calling to state that her AVS does not have celebrex and her pharmacy did not get the Rx for it. Advised medication may not have been sent in yet, as appointment was just completed.

## 2024-02-29 ENCOUNTER — Encounter: Payer: Self-pay | Admitting: Medical-Surgical

## 2024-02-29 LAB — CYTOLOGY - PAP
Comment: NEGATIVE
Diagnosis: NEGATIVE
High risk HPV: NEGATIVE

## 2024-03-02 LAB — CBC WITH DIFFERENTIAL/PLATELET
Basophils Absolute: 0 10*3/uL (ref 0.0–0.2)
Basos: 0 %
EOS (ABSOLUTE): 0.1 10*3/uL (ref 0.0–0.4)
Eos: 1 %
Hematocrit: 40.7 % (ref 34.0–46.6)
Hemoglobin: 13.3 g/dL (ref 11.1–15.9)
Immature Grans (Abs): 0 10*3/uL (ref 0.0–0.1)
Immature Granulocytes: 0 %
Lymphocytes Absolute: 2.1 10*3/uL (ref 0.7–3.1)
Lymphs: 37 %
MCH: 28.6 pg (ref 26.6–33.0)
MCHC: 32.7 g/dL (ref 31.5–35.7)
MCV: 88 fL (ref 79–97)
Monocytes Absolute: 0.4 10*3/uL (ref 0.1–0.9)
Monocytes: 8 %
Neutrophils Absolute: 3 10*3/uL (ref 1.4–7.0)
Neutrophils: 54 %
Platelets: 301 10*3/uL (ref 150–450)
RBC: 4.65 x10E6/uL (ref 3.77–5.28)
RDW: 12.9 % (ref 11.7–15.4)
WBC: 5.6 10*3/uL (ref 3.4–10.8)

## 2024-03-02 LAB — CMP14+EGFR
ALT: 16 IU/L (ref 0–32)
AST: 17 IU/L (ref 0–40)
Albumin: 4.5 g/dL (ref 3.8–4.9)
Alkaline Phosphatase: 87 IU/L (ref 44–121)
BUN/Creatinine Ratio: 19 (ref 9–23)
BUN: 15 mg/dL (ref 6–24)
Bilirubin Total: 0.5 mg/dL (ref 0.0–1.2)
CO2: 24 mmol/L (ref 20–29)
Calcium: 9.2 mg/dL (ref 8.7–10.2)
Chloride: 104 mmol/L (ref 96–106)
Creatinine, Ser: 0.81 mg/dL (ref 0.57–1.00)
Globulin, Total: 2.2 g/dL (ref 1.5–4.5)
Glucose: 94 mg/dL (ref 70–99)
Potassium: 3.9 mmol/L (ref 3.5–5.2)
Sodium: 141 mmol/L (ref 134–144)
Total Protein: 6.7 g/dL (ref 6.0–8.5)
eGFR: 85 mL/min/{1.73_m2} (ref >59)

## 2024-03-02 LAB — LIPID PANEL
Chol/HDL Ratio: 1.8 ratio (ref 0.0–4.4)
Cholesterol, Total: 145 mg/dL (ref 100–199)
HDL: 81 mg/dL (ref >39)
LDL Chol Calc (NIH): 53 mg/dL (ref 0–99)
Triglycerides: 51 mg/dL (ref 0–149)
VLDL Cholesterol Cal: 11 mg/dL (ref 5–40)

## 2024-03-02 LAB — TSH: TSH: 2 u[IU]/mL (ref 0.450–4.500)

## 2024-03-02 NOTE — Telephone Encounter (Signed)
 Completed and placed in your folder for you to sign.

## 2024-04-09 ENCOUNTER — Other Ambulatory Visit: Payer: Self-pay | Admitting: Medical-Surgical

## 2024-04-09 ENCOUNTER — Encounter: Payer: Self-pay | Admitting: Medical-Surgical

## 2024-04-11 ENCOUNTER — Other Ambulatory Visit: Payer: Self-pay

## 2024-04-11 ENCOUNTER — Other Ambulatory Visit: Payer: Self-pay | Admitting: Medical-Surgical

## 2024-04-11 MED ORDER — ALPRAZOLAM 0.25 MG PO TABS: 0.2500 mg | ORAL_TABLET | Freq: Every day | ORAL | 0 refills | Status: DC | PRN

## 2024-04-11 MED ORDER — LEVOTHYROXINE SODIUM 50 MCG PO TABS: 50.0000 ug | ORAL_TABLET | Freq: Every day | ORAL | 3 refills | Status: AC

## 2024-04-11 MED ORDER — ROSUVASTATIN CALCIUM 5 MG PO TABS: 5.0000 mg | ORAL_TABLET | Freq: Every day | ORAL | 3 refills | Status: AC

## 2024-04-11 MED ORDER — YUVAFEM 10 MCG VA TABS: 1.0000 | ORAL_TABLET | VAGINAL | 1 refills | Status: AC

## 2024-07-15 ENCOUNTER — Other Ambulatory Visit (HOSPITAL_BASED_OUTPATIENT_CLINIC_OR_DEPARTMENT_OTHER): Payer: Self-pay | Admitting: Medical-Surgical

## 2024-07-15 DIAGNOSIS — Z1231 Encounter for screening mammogram for malignant neoplasm of breast: Secondary | ICD-10-CM

## 2024-07-20 ENCOUNTER — Ambulatory Visit

## 2024-07-20 DIAGNOSIS — Z1231 Encounter for screening mammogram for malignant neoplasm of breast: Secondary | ICD-10-CM | POA: Diagnosis not present

## 2024-07-22 ENCOUNTER — Ambulatory Visit: Payer: Self-pay | Admitting: Medical-Surgical

## 2024-08-09 ENCOUNTER — Encounter: Payer: Self-pay | Admitting: Medical-Surgical

## 2024-08-11 MED ORDER — ALPRAZOLAM 0.25 MG PO TABS: 0.2500 mg | ORAL_TABLET | Freq: Every day | ORAL | 0 refills | Status: DC | PRN

## 2024-11-06 ENCOUNTER — Encounter: Payer: Self-pay | Admitting: Medical-Surgical

## 2024-11-08 ENCOUNTER — Ambulatory Visit: Admitting: Medical-Surgical

## 2024-11-08 ENCOUNTER — Ambulatory Visit

## 2024-11-08 VITALS — BP 132/93 | HR 89 | Resp 18 | Wt 135.8 lb

## 2024-11-08 DIAGNOSIS — R202 Paresthesia of skin: Secondary | ICD-10-CM | POA: Diagnosis not present

## 2024-11-08 NOTE — Progress Notes (Unsigned)
        Established patient visit   History of Present Illness   Discussed the use of AI scribe software for clinical note transcription with the patient, who gave verbal consent to proceed.  History of Present Illness   Kelly Holmes is a 58 year old female who presents with persistent nerve pain and numbness in her right arm and hand.  Neuropathic pain and paresthesia in right upper extremity - Persistent throbbing nerve pain and numbness in the left arm and hand, worsening over the past three weeks - Pain originates in the back of the shoulder, wraps around, and radiates down the arm into the hand - Numbness and tingling affecting the entire hand, beginning at the wrist - Loss of strength on the left side, impairing ability to perform activities such as Pilates and prolonged sitting for work - Sensations described as 'someone's laying a match on your skin' - Certain arm positions, especially overhead, exacerbate numbness and tingling - No color changes in the hands, but extremities are frequently cold  Pain management and therapeutic interventions - Receiving chiropractic care, acupuncture, and massage therapy, which provide partial relief - Continues to see an acupuncturist and finds it beneficial - Uses [facility] app for physical therapy exercises - Takes acetaminophen for pain management - Unable to take Celebrex  daily due to concerns about long-term use - Has not opted for medications such as gabapentin or Lyrica     Physical Exam   Physical Exam Vitals reviewed.  Constitutional:      General: She is not in acute distress.    Appearance: Normal appearance. She is not ill-appearing.  HENT:     Head: Normocephalic and atraumatic.  Cardiovascular:     Rate and Rhythm: Normal rate and regular rhythm.  Pulmonary:     Effort: Pulmonary effort is normal. No respiratory distress.  Skin:    General: Skin is warm and dry.  Neurological:     Mental Status: She is alert and  oriented to person, place, and time.  Psychiatric:        Mood and Affect: Mood normal.        Behavior: Behavior normal.        Thought Content: Thought content normal.        Judgment: Judgment normal.    Assessment & Plan   Problem List Items Addressed This Visit   None Visit Diagnoses       Arm paresthesia, left    -  Primary   Relevant Orders   DG Chest 2 View   Ambulatory referral to Neurology   US  Venous Img Upper Uni Left (DVT)      Assessment and Plan    Suspected thoracic outlet syndrome with left upper extremity nerve pain and paresthesia Chronic nerve pain and paresthesia in the left upper extremity, likely nerve-related. Differential includes thoracic outlet syndrome. Non-pharmacological treatments preferred, with acupuncture providing improvement. - Ordered chest x-ray for bony abnormalities. - Ordered ultrasound for blood flow assessment. - Ordered nerve conduction study pending initial test results. - Continue acupuncture for pain management. - Use acetaminophen as needed for pain.     Follow up   Return if symptoms worsen or fail to improve. __________________________________ Zada FREDRIK Palin, DNP, APRN, FNP-BC Primary Care and Sports Medicine [facility] [place]

## 2024-11-09 ENCOUNTER — Ambulatory Visit: Payer: Self-pay | Admitting: Medical-Surgical

## 2024-12-23 ENCOUNTER — Encounter: Payer: Self-pay | Admitting: Medical-Surgical

## 2024-12-26 ENCOUNTER — Other Ambulatory Visit: Payer: Self-pay | Admitting: Medical-Surgical

## 2024-12-26 NOTE — Telephone Encounter (Signed)
 Spoke with Debbie B. From Dr. Fern office who is going to fax over the test results to [phone]

## 2024-12-27 ENCOUNTER — Encounter: Payer: Self-pay | Admitting: Medical-Surgical

## 2024-12-27 ENCOUNTER — Ambulatory Visit: Payer: Self-pay | Admitting: Medical-Surgical

## 2024-12-27 NOTE — Telephone Encounter (Signed)
 Last filled 08/11/2024  Last OV 11/08/2024  Upcoming appointment 02/27/2025

## 2025-02-27 ENCOUNTER — Encounter: Admitting: Medical-Surgical
